# Patient Record
Sex: Male | Born: 1965 | Race: Black or African American | Hispanic: No | Marital: Married | Smoking: Former smoker
Health system: Southern US, Community
[De-identification: ages and names within clinical notes are randomized; demographics above are authoritative.]

## PROBLEM LIST (undated history)

## (undated) HISTORY — PX: TOOTH EXTRACTION: SUR596

---

## 2008-01-21 ENCOUNTER — Encounter: Admission: RE | Admit: 2008-01-21 | Discharge: 2008-01-21 | Payer: Self-pay | Admitting: Family Medicine

## 2009-04-19 ENCOUNTER — Ambulatory Visit (HOSPITAL_BASED_OUTPATIENT_CLINIC_OR_DEPARTMENT_OTHER): Admission: RE | Admit: 2009-04-19 | Discharge: 2009-04-19 | Payer: Self-pay | Admitting: Urology

## 2011-03-22 ENCOUNTER — Encounter: Payer: Self-pay | Admitting: Internal Medicine

## 2011-03-22 ENCOUNTER — Ambulatory Visit (INDEPENDENT_AMBULATORY_CARE_PROVIDER_SITE_OTHER): Payer: 59 | Admitting: Internal Medicine

## 2011-03-22 ENCOUNTER — Other Ambulatory Visit (INDEPENDENT_AMBULATORY_CARE_PROVIDER_SITE_OTHER): Payer: 59

## 2011-03-22 VITALS — BP 122/80 | HR 81 | Temp 98.3°F | Resp 16 | Ht 67.0 in | Wt 182.0 lb

## 2011-03-22 DIAGNOSIS — I10 Essential (primary) hypertension: Secondary | ICD-10-CM | POA: Insufficient documentation

## 2011-03-22 DIAGNOSIS — Z0000 Encounter for general adult medical examination without abnormal findings: Secondary | ICD-10-CM

## 2011-03-22 DIAGNOSIS — Z720 Tobacco use: Secondary | ICD-10-CM | POA: Insufficient documentation

## 2011-03-22 DIAGNOSIS — Z136 Encounter for screening for cardiovascular disorders: Secondary | ICD-10-CM | POA: Insufficient documentation

## 2011-03-22 DIAGNOSIS — F17200 Nicotine dependence, unspecified, uncomplicated: Secondary | ICD-10-CM

## 2011-03-22 DIAGNOSIS — R9431 Abnormal electrocardiogram [ECG] [EKG]: Secondary | ICD-10-CM | POA: Insufficient documentation

## 2011-03-22 DIAGNOSIS — R55 Syncope and collapse: Secondary | ICD-10-CM | POA: Insufficient documentation

## 2011-03-22 LAB — URINALYSIS, ROUTINE W REFLEX MICROSCOPIC
Hgb urine dipstick: NEGATIVE
Nitrite: NEGATIVE
Specific Gravity, Urine: 1.02 (ref 1.000–1.030)
Total Protein, Urine: NEGATIVE
Urine Glucose: NEGATIVE
pH: 7 (ref 5.0–8.0)

## 2011-03-22 LAB — CBC WITH DIFFERENTIAL/PLATELET
Basophils Relative: 0.7 % (ref 0.0–3.0)
Eosinophils Absolute: 0.3 10*3/uL (ref 0.0–0.7)
Eosinophils Relative: 4.1 % (ref 0.0–5.0)
HCT: 46.8 % (ref 39.0–52.0)
Lymphs Abs: 2.3 10*3/uL (ref 0.7–4.0)
MCHC: 33.5 g/dL (ref 30.0–36.0)
MCV: 91.6 fl (ref 78.0–100.0)
Monocytes Absolute: 0.6 10*3/uL (ref 0.1–1.0)
Neutrophils Relative %: 52 % (ref 43.0–77.0)
RBC: 5.11 Mil/uL (ref 4.22–5.81)
WBC: 6.7 10*3/uL (ref 4.5–10.5)

## 2011-03-22 LAB — COMPREHENSIVE METABOLIC PANEL
AST: 18 U/L (ref 0–37)
Alkaline Phosphatase: 64 U/L (ref 39–117)
BUN: 17 mg/dL (ref 6–23)
Creatinine, Ser: 1.4 mg/dL (ref 0.4–1.5)
Glucose, Bld: 93 mg/dL (ref 70–99)
Potassium: 4.4 mEq/L (ref 3.5–5.1)
Total Bilirubin: 0 mg/dL — ABNORMAL LOW (ref 0.3–1.2)

## 2011-03-22 LAB — LIPID PANEL
Cholesterol: 190 mg/dL (ref 0–200)
LDL Cholesterol: 114 mg/dL — ABNORMAL HIGH (ref 0–99)
Triglycerides: 147 mg/dL (ref 0.0–149.0)

## 2011-03-22 LAB — PSA: PSA: 0.42 ng/mL (ref 0.10–4.00)

## 2011-03-22 NOTE — Assessment & Plan Note (Signed)
He agrees to try to quit smoking but he does not want any "helpers" he will try to abruptly quit on his own

## 2011-03-22 NOTE — Patient Instructions (Addendum)
Health Maintenance, Males A healthy lifestyle and preventative care can promote health and wellness.  Maintain regular health, dental, and eye exams.   Eat a healthy diet. Foods like vegetables, fruits, whole grains, low-fat dairy products, and lean protein foods contain the nutrients you need without too many calories. Decrease your intake of foods high in solid fats, added sugars, and salt. Get information about a proper diet from your caregiver, if necessary.   Regular physical exercise is one of the most important things you can do for your health. Most adults should get at least 150 minutes of moderate-intensity exercise (any activity that increases your heart rate and causes you to sweat) each week. In addition, most adults need muscle-strengthening exercises on 2 or more days a week.    Maintain a healthy weight. The body mass index (BMI) is a screening tool to identify possible weight problems. It provides an estimate of body fat based on height and weight. Your caregiver can help determine your BMI, and can help you achieve or maintain a healthy weight. For adults 20 years and older:   A BMI below 18.5 is considered underweight.   A BMI of 18.5 to 24.9 is normal.   A BMI of 25 to 29.9 is considered overweight.   A BMI of 30 and above is considered obese.   Maintain normal blood lipids and cholesterol by exercising and minimizing your intake of saturated fat. Eat a balanced diet with plenty of fruits and vegetables. Blood tests for lipids and cholesterol should begin at age 20 and be repeated every 5 years. If your lipid or cholesterol levels are high, you are over 50, or you are a high risk for heart disease, you may need your cholesterol levels checked more frequently.Ongoing high lipid and cholesterol levels should be treated with medicines, if diet and exercise are not effective.   If you smoke, find out from your caregiver how to quit. If you do not use tobacco, do not start.    If you choose to drink alcohol, do not exceed 2 drinks per day. One drink is considered to be 12 ounces (355 mL) of beer, 5 ounces (148 mL) of wine, or 1.5 ounces (44 mL) of liquor.   Avoid use of street drugs. Do not share needles with anyone. Ask for help if you need support or instructions about stopping the use of drugs.   High blood pressure causes heart disease and increases the risk of stroke. Blood pressure should be checked at least every 1 to 2 years. Ongoing high blood pressure should be treated with medicines if weight loss and exercise are not effective.   If you are 45 to 45 years old, ask your caregiver if you should take aspirin to prevent heart disease.   Diabetes screening involves taking a blood sample to check your fasting blood sugar level. This should be done once every 3 years, after age 45, if you are within normal weight and without risk factors for diabetes. Testing should be considered at a younger age or be carried out more frequently if you are overweight and have at least 1 risk factor for diabetes.   Colorectal cancer can be detected and often prevented. Most routine colorectal cancer screening begins at the age of 50 and continues through age 75. However, your caregiver may recommend screening at an earlier age if you have risk factors for colon cancer. On a yearly basis, your caregiver may provide home test kits to check for hidden   blood in the stool. Use of a small camera at the end of a tube, to directly examine the colon (sigmoidoscopy or colonoscopy), can detect the earliest forms of colorectal cancer. Talk to your caregiver about this at age 60, when routine screening begins. Direct examination of the colon should be repeated every 5 to 10 years through age 21, unless early forms of pre-cancerous polyps or small growths are found.   Hepatitis C blood testing is recommended for all people born from [birth year] through 1964 and any individual with known risks for  hepatitis C.   Healthy men should no longer receive prostate-specific antigen (PSA) blood tests as part of routine cancer screening. Consult with your caregiver about prostate cancer screening.   Testicular cancer screening is not recommended for adolescents or adult males who have no symptoms. Screening includes self-exam, caregiver exam, and other screening tests. Consult with your caregiver about any symptoms you have or any concerns you have about testicular cancer.   Practice safe sex. Use condoms and avoid high-risk sexual practices to reduce the spread of sexually transmitted infections (STIs).   Use sunscreen with a sun protection factor (SPF) of 30 or greater. Apply sunscreen liberally and repeatedly throughout the day. You should seek shade when your shadow is shorter than you. Protect yourself by wearing long sleeves, pants, a wide-brimmed hat, and sunglasses year round, whenever you are outdoors.   Notify your caregiver of new moles or changes in moles, especially if there is a change in shape or color. Also notify your caregiver if a mole is larger than the size of a pencil eraser.   A one-time screening for abdominal aortic aneurysm (AAA) and surgical repair of large AAAs by sound wave imaging (ultrasonography) is recommended for ages 22 to 21 years who are current or former smokers.   Stay current with your immunizations.  Document Released: 06/23/2007 Document Revised: 12/14/2010 Document Reviewed: 05/22/2010 [facility] Patient Information 2012 [place], [place].Smoking Cessation This document explains the best ways for you to quit smoking and new treatments to help. It lists new medicines that can double or triple your chances of quitting and quitting for good. It also considers ways to avoid relapses and concerns you may have about quitting, including weight gain. NICOTINE: A POWERFUL ADDICTION If you have tried to quit smoking, you know how hard it can be. It is hard because  nicotine is a very addictive drug. For some people, it can be as addictive as heroin or cocaine. Usually, people make 2 or 3 tries, or more, before finally being able to quit. Each time you try to quit, you can learn about what helps and what hurts. Quitting takes hard work and a lot of effort, but you can quit smoking. QUITTING SMOKING IS ONE OF THE MOST IMPORTANT THINGS YOU WILL EVER DO.  You will live longer, feel better, and live better.   The impact on your body of quitting smoking is felt almost immediately:   Within 20 minutes, blood pressure decreases. Pulse returns to its normal level.   After 8 hours, carbon monoxide levels in the blood return to normal. Oxygen level increases.   After 24 hours, chance of heart attack starts to decrease. Breath, hair, and body stop smelling like smoke.   After 48 hours, damaged nerve endings begin to recover. Sense of taste and smell improve.   After 72 hours, the body is virtually free of nicotine. Bronchial tubes relax and breathing becomes easier.   After 2 to  12 weeks, lungs can hold more air. Exercise becomes easier and circulation improves.   Quitting will reduce your risk of having a heart attack, stroke, cancer, or lung disease:   After 1 year, the risk of coronary heart disease is cut in half.   After 5 years, the risk of stroke falls to the same as a nonsmoker.   After 10 years, the risk of lung cancer is cut in half and the risk of other cancers decreases significantly.   After 15 years, the risk of coronary heart disease drops, usually to the level of a nonsmoker.   If you are pregnant, quitting smoking will improve your chances of having a healthy baby.   The people you live with, especially your children, will be healthier.   You will have extra money to spend on things other than cigarettes.  FIVE KEYS TO QUITTING Studies have shown that these 5 steps will help you quit smoking and quit for good. You have the best chances  of quitting if you use them together: 1. Get ready.  2. Get support and encouragement.  3. Learn new skills and behaviors.  4. Get medicine to reduce your nicotine addiction and use it correctly.  5. Be prepared for relapse or difficult situations. Be determined to continue trying to quit, even if you do not succeed at first.  1. GET READY  Set a quit date.   Change your environment.   Get rid of ALL cigarettes, ashtrays, matches, and lighters in your home, car, and place of work.   Do not let people smoke in your home.   Review your past attempts to quit. Think about what worked and what did not.   Once you quit, do not smoke. NOT EVEN A PUFF!  2. GET SUPPORT AND ENCOURAGEMENT Studies have shown that you have a better chance of being successful if you have help. You can get support in many ways.  Tell your family, friends, and coworkers that you are going to quit and need their support. Ask them not to smoke around you.   Talk to your caregivers (doctor, dentist, nurse, pharmacist, psychologist, and/or smoking counselor).   Get individual, group, or telephone counseling and support. The more counseling you have, the better your chances are of quitting. Programs are available at [employer] and health centers. Call your local health department for information about programs in your area.   Spiritual beliefs and practices may help some smokers quit.   Quit meters are Photographer that keep track of quit statistics, such as amount of "quit-time," cigarettes not smoked, and money saved.   Many smokers find one or more of the many self-help books available useful in helping them quit and stay off tobacco.  3. LEARN NEW SKILLS AND BEHAVIORS  Try to distract yourself from urges to smoke. Talk to someone, go for a walk, or occupy your time with a task.   When you first try to quit, change your routine. Take a different route to work. Drink tea  instead of coffee. Eat breakfast in a different place.   Do something to reduce your stress. Take a hot bath, exercise, or read a book.   Plan something enjoyable to do every day. Reward yourself for not smoking.   Explore interactive web-based programs that specialize in helping you quit.  4. GET MEDICINE AND USE IT CORRECTLY Medicines can help you stop smoking and decrease the urge to smoke. Combining medicine  with the above behavioral methods and support can quadruple your chances of successfully quitting smoking. The [place] Food and Drug Administration (FDA) has approved 7 medicines to help you quit smoking. These medicines fall into 3 categories.  Nicotine replacement therapy (delivers nicotine to your body without the negative effects and risks of smoking):   Nicotine gum: Available over-the-counter.   Nicotine lozenges: Available over-the-counter.   Nicotine inhaler: Available by prescription.   Nicotine nasal spray: Available by prescription.   Nicotine skin patches (transdermal): Available by prescription and over-the-counter.   Antidepressant medicine (helps people abstain from smoking, but how this works is unknown):   Bupropion sustained-release (SR) tablets: Available by prescription.   Nicotinic receptor partial agonist (simulates the effect of nicotine in your brain):   Varenicline tartrate tablets: Available by prescription.   Ask your caregiver for advice about which medicines to use and how to use them. Carefully read the information on the package.   Everyone who is trying to quit may benefit from using a medicine. If you are pregnant or trying to become pregnant, nursing an infant, you are under age 36, or you smoke fewer than 10 cigarettes per day, talk to your caregiver before taking any nicotine replacement medicines.   You should stop using a nicotine replacement product and call your caregiver if you experience nausea, dizziness, weakness, vomiting, fast or  irregular heartbeat, mouth problems with the lozenge or gum, or redness or swelling of the skin around the patch that does not go away.   Do not use any other product containing nicotine while using a nicotine replacement product.   Talk to your caregiver before using these products if you have diabetes, heart disease, asthma, stomach ulcers, you had a recent heart attack, you have high blood pressure that is not controlled with medicine, a history of irregular heartbeat, or you have been prescribed medicine to help you quit smoking.  5. BE PREPARED FOR RELAPSE OR DIFFICULT SITUATIONS  Most relapses occur within the first 3 months after quitting. Do not be discouraged if you start smoking again. Remember, most people try several times before they finally quit.   You may have symptoms of withdrawal because your body is used to nicotine. You may crave cigarettes, be irritable, feel very hungry, cough often, get headaches, or have difficulty concentrating.   The withdrawal symptoms are only temporary. They are strongest when you first quit, but they will go away within 10 to 14 days.  Here are some difficult situations to watch for:  Alcohol. Avoid drinking alcohol. Drinking lowers your chances of successfully quitting.   Caffeine. Try to reduce the amount of caffeine you consume. It also lowers your chances of successfully quitting.   Other smokers. Being around smoking can make you want to smoke. Avoid smokers.   Weight gain. Many smokers will gain weight when they quit, usually less than 10 pounds. Eat a healthy diet and stay active. Do not let weight gain distract you from your main goal, quitting smoking. Some medicines that help you quit smoking may also help delay weight gain. You can always lose the weight gained after you quit.   Bad mood or depression. There are a lot of ways to improve your mood other than smoking.  If you are having problems with any of these situations, talk to your  caregiver. SPECIAL SITUATIONS AND CONDITIONS Studies suggest that everyone can quit smoking. Your situation or condition can give you a special reason to quit.  Pregnant  women/new mothers: By quitting, you protect your baby's health and your own.   Hospitalized patients: By quitting, you reduce health problems and help healing.   Heart attack patients: By quitting, you reduce your risk of a second heart attack.   Lung, head, and neck cancer patients: By quitting, you reduce your chance of a second cancer.   Parents of children and adolescents: By quitting, you protect your children from illnesses caused by secondhand smoke.  QUESTIONS TO THINK ABOUT Think about the following questions before you try to stop smoking. You may want to talk about your answers with your caregiver.  Why do you want to quit?   If you tried to quit in the past, what helped and what did not?   What will be the most difficult situations for you after you quit? How will you plan to handle them?   Who can help you through the tough times? Your family? Friends? Caregiver?   What pleasures do you get from smoking? What ways can you still get pleasure if you quit?  Here are some questions to ask your caregiver:  How can you help me to be successful at quitting?   What medicine do you think would be best for me and how should I take it?   What should I do if I need more help?   What is smoking withdrawal like? How can I get information on withdrawal?  Quitting takes hard work and a lot of effort, but you can quit smoking. FOR MORE INFORMATION  Smokefree.gov (http://www.davis-sullivan.com/) provides free, accurate, evidence-based information and professional assistance to help support the immediate and long-term needs of people trying to quit smoking. Document Released: 12/19/2000 Document Revised: 12/14/2010 Document Reviewed: 10/11/2008 [facility] Patient Information 2012 [place], [place].

## 2011-03-22 NOTE — Assessment & Plan Note (Signed)
EKG is abnormal 

## 2011-03-22 NOTE — Assessment & Plan Note (Signed)
Exam done, labs ordered, vaccines were updated, pt ed material was given 

## 2011-03-22 NOTE — Progress Notes (Signed)
Subjective:    Patient ID: Jeffrey Deleon, male    DOB: [birth year], 46 y.o.   MRN: [id number]  Loss of Consciousness This is a new problem. Episode onset: one month ago. The problem has been unchanged. Exacerbated by: drank a lot of Etoh, smoked and played video games all day, blood sugar=59 after he was out for about 5 secs. Pertinent negatives include no abdominal pain, auditory change, aura, back pain, bladder incontinence, bowel incontinence, chest pain, clumsiness, confusion, diaphoresis, dizziness, fever, focal sensory loss, focal weakness, headaches, light-headedness, nausea, palpitations, slurred speech, vertigo, visual change, vomiting or weakness. He has tried nothing for the symptoms.      Review of Systems  Constitutional: Negative.  Negative for fever and diaphoresis.  HENT: Negative.   Eyes: Negative.   Respiratory: Negative for apnea, cough, choking, chest tightness, shortness of breath, wheezing and stridor.   Cardiovascular: Positive for syncope. Negative for chest pain, palpitations and leg swelling.  Gastrointestinal: Negative for nausea, vomiting, abdominal pain, diarrhea, constipation, blood in stool, abdominal distention, anal bleeding, rectal pain and bowel incontinence.  Genitourinary: Negative.  Negative for bladder incontinence.  Musculoskeletal: Negative for myalgias, back pain, joint swelling, arthralgias and gait problem.  Skin: Negative for color change, pallor, rash and wound.  Neurological: Positive for syncope. Negative for dizziness, vertigo, tremors, focal weakness, seizures, facial asymmetry, speech difficulty, weakness, light-headedness, numbness and headaches.  Hematological: Negative for adenopathy. Does not bruise/bleed easily.  Psychiatric/Behavioral: Negative.  Negative for confusion.       Objective:   Physical Exam  Vitals reviewed. Constitutional: He is oriented to person, place, and time. He appears well-developed and well-nourished. No  distress.  HENT:  Head: Normocephalic and atraumatic.  Mouth/Throat: Oropharynx is clear and moist. No oropharyngeal exudate.  Eyes: Conjunctivae are normal. Right eye exhibits no discharge. Left eye exhibits no discharge. No scleral icterus.  Neck: Normal range of motion. Neck supple. No JVD present. No tracheal deviation present. No thyromegaly present.  Cardiovascular: Normal rate, regular rhythm, normal heart sounds and intact distal pulses.  Exam reveals no gallop and no friction rub.   No murmur heard. Pulmonary/Chest: Effort normal and breath sounds normal. No stridor. No respiratory distress. He has no wheezes. He has no rales. He exhibits no tenderness.  Abdominal: Soft. Bowel sounds are normal. He exhibits no distension. There is no tenderness. There is no rebound and no guarding. Hernia confirmed negative in the right inguinal area and confirmed negative in the left inguinal area.  Genitourinary: Rectum normal, testes normal and penis normal. Rectal exam shows no external hemorrhoid, no internal hemorrhoid, no fissure, no mass, no tenderness and anal tone normal. Guaiac negative stool. 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 Prostate is not tender. Right testis shows no mass, no swelling and no tenderness. Right testis is descended. Left testis shows no mass, no swelling and no tenderness. Left testis is descended. Circumcised. No penile tenderness. No discharge found.  Musculoskeletal: Normal range of motion. He exhibits no edema and no tenderness.  Lymphadenopathy:    He has no cervical adenopathy.       Right: No inguinal adenopathy present.       Left: No inguinal adenopathy present.  Neurological: He is oriented to person, place, and time. He displays normal reflexes. No cranial nerve deficit. He exhibits normal muscle tone. Coordination normal.  Skin: Skin is warm and dry. No rash noted. He is not diaphoretic. No erythema. No pallor.  Psychiatric: He has a normal mood and  affect. His behavior is normal. Judgment and thought content normal.          Assessment & Plan:

## 2011-03-22 NOTE — Assessment & Plan Note (Signed)
The episode one month ago had features of low blood sugar due to self neglect and a binge on beer and hennessey but he also has an abnormal EKG so I have sent him to cardiology, he has not had any recurrences since then so I do not see any need for urgent intervention at this time

## 2011-03-22 NOTE — Assessment & Plan Note (Signed)
He has q waves and t wave changes in V1 and V2, I am concerned that he may have had a cardiac event one month ago and therefore I referred him to cardiology

## 2011-04-23 ENCOUNTER — Ambulatory Visit (INDEPENDENT_AMBULATORY_CARE_PROVIDER_SITE_OTHER): Payer: 59 | Admitting: Cardiovascular Disease

## 2011-04-23 ENCOUNTER — Encounter: Payer: Self-pay | Admitting: Cardiovascular Disease

## 2011-04-23 ENCOUNTER — Institutional Professional Consult (permissible substitution): Payer: 59 | Admitting: Cardiology

## 2011-04-23 VITALS — BP 137/92 | HR 88 | Ht 68.0 in | Wt 185.0 lb

## 2011-04-23 DIAGNOSIS — R55 Syncope and collapse: Secondary | ICD-10-CM

## 2011-04-23 DIAGNOSIS — R9431 Abnormal electrocardiogram [ECG] [EKG]: Secondary | ICD-10-CM

## 2011-04-23 DIAGNOSIS — Z720 Tobacco use: Secondary | ICD-10-CM

## 2011-04-23 DIAGNOSIS — F17200 Nicotine dependence, unspecified, uncomplicated: Secondary | ICD-10-CM

## 2011-04-23 NOTE — Assessment & Plan Note (Signed)
He has mild changes in the precordial leads. Only risk factor for CAD is smoking. Will arrange exercise treadmill stress test to exclude ischemia.

## 2011-04-23 NOTE — Progress Notes (Signed)
   History of Present Illness: 46 yo male with no significant past medical who is referred today for evaluation of a syncopal episode and abnormal EKG. He was seen recently by Dr. Yetta Barre. He reports that he was home playing x-box and drinking etoh, smoking. He stood up and walked to the other room and he sat down. He felt cold, clammy, diaphoretic and he passed out. His wife witnessed this and says he was out for 15 seconds. No seizure activity. No post-ictal state.   He tells me that he feels well. NO recurrent episodes of syncope. He works for the [employer] and has no chest pain or SOB while at work. He plays video games on his days off. He is trying to stop smoking. He does not exercise.   Primary Care Physician: Sanda Linger  PMH: None  Past Surgical History  Procedure Date  . Tooth extraction     Current Outpatient Prescriptions  Medication Sig Dispense Refill  . ibuprofen (ADVIL) 200 MG tablet Take 200 mg by mouth every 6 (six) hours as needed.      . Naproxen Sodium (ALEVE) 220 MG CAPS Take by mouth. As needed for headaches        No Known Allergies  History   Social History  . Marital Status: Married    Spouse Name: N/A    Number of Children: 2  . Years of Education: N/A   Occupational History  . [employer]   Social History Main Topics  . Smoking status: Current Everyday Smoker -- 0.5 packs/day for 30 years    Types: Cigarettes  . Smokeless tobacco: Not on file  . Alcohol Use: 2.4 oz/week    2 Cans of beer, 2 Shots of liquor per week  . Drug Use: No  . Sexually Active: Yes   Other Topics Concern  . Not on file   Social History Narrative   Caffienated drinks-yesSeat belt use often-yesRegular Exercise-noSmoke alarm in the home-yesFirearms/guns in the home-yesHistory of physical abuse-no    Family History  Problem Relation Age of Onset  . Alcohol abuse Neg Hx   . Diabetes Neg Hx   . Early death Neg Hx   . Heart disease Neg  Hx   . Hyperlipidemia Neg Hx   . Kidney disease Neg Hx   . Hypertension Neg Hx   . Stroke Neg Hx   . Cancer Father     Pancreatic    Review of Systems:  As stated in the HPI and otherwise negative.   BP 137/92  Ht 5\' 8"  (1.727 m)  Wt 185 lb (83.915 kg)  BMI 28.13 kg/m2  Physical Examination: General: Well developed, well nourished, NAD HEENT: OP clear, mucus membranes moist SKIN: warm, dry. No rashes. Neuro: No focal deficits Musculoskeletal: Muscle strength 5/5 all ext Psychiatric: Mood and affect normal Neck: No JVD, no carotid bruits, no thyromegaly, no lymphadenopathy. Lungs:Clear bilaterally, no wheezes, rhonci, crackles Cardiovascular: Regular rate and rhythm. No murmurs, gallops or rubs. Abdomen:Soft. Bowel sounds present. Non-tender.  Extremities: No lower extremity edema. Pulses are 2 + in the bilateral DP/PT.  EKG: NSR, rate 88 bpm.

## 2011-04-23 NOTE — Assessment & Plan Note (Signed)
Smoking cessation is advised.

## 2011-04-23 NOTE — Assessment & Plan Note (Signed)
No further episodes. ?etiology. Will get echo to assess LV size and function. He is advised to cut back on etoh intake

## 2011-04-23 NOTE — Patient Instructions (Signed)
Your physician recommends that you schedule a follow-up appointment in: 3-4 weeks.   Your physician has requested that you have an exercise tolerance test. For further information please visit [email or web address]. Please also follow instruction sheet, as given.   Your physician has requested that you have an echocardiogram. Echocardiography is a painless test that uses sound waves to create images of your heart. It provides your doctor with information about the size and shape of your heart and how well your heart's chambers and valves are working. This procedure takes approximately one hour. There are no restrictions for this procedure.    

## 2011-05-02 ENCOUNTER — Encounter: Payer: Self-pay | Admitting: Nurse Practitioner

## 2011-05-02 ENCOUNTER — Ambulatory Visit (INDEPENDENT_AMBULATORY_CARE_PROVIDER_SITE_OTHER): Payer: 59 | Admitting: Nurse Practitioner

## 2011-05-02 DIAGNOSIS — R55 Syncope and collapse: Secondary | ICD-10-CM

## 2011-05-02 DIAGNOSIS — R9431 Abnormal electrocardiogram [ECG] [EKG]: Secondary | ICD-10-CM

## 2011-05-02 NOTE — Procedures (Signed)
Exercise Treadmill Test  Pre-Exercise Testing Evaluation Rhythm: normal sinus  Rate: 98   PR:  .14 QRS:  .08  QT:  .33 QTc: .42     Test  Exercise Tolerance Test Ordering MD: Melene Muller, MD  Interpreting MD:  Ward Givens , NP  Unique Test No: 1  Treadmill:  1  Indication for ETT: Syncope  Contraindication to ETT: No   Stress Modality: exercise - treadmill  Cardiac Imaging Performed: non   Protocol: standard Bruce - maximal  Max BP:  158/74  Max MPHR (bpm):  176 85% MPR (bpm):  149  MPHR obtained (bpm):  181 % MPHR obtained:  102%  Reached 85% MPHR (min:sec): 6:00 Total Exercise Time (min-sec):  9:37  Workload in METS:  11.0 Borg Scale: 15  Reason ETT Terminated:  fatigue    ST Segment Analysis At Rest: normal ST segments - no evidence of significant ST depression With Exercise: no evidence of significant ST depression  Other Information Arrhythmia:  No Angina during ETT:  absent (0) Quality of ETT:  diagnostic  ETT Interpretation:  normal - no evidence of ischemia by ST analysis  Comments: Good exercise tolerance, no acute st/t changes.  Recommendations: F/U Dr. Clifton [place]

## 2011-05-09 ENCOUNTER — Institutional Professional Consult (permissible substitution): Payer: 59 | Admitting: Cardiology

## 2011-05-09 ENCOUNTER — Other Ambulatory Visit: Payer: Self-pay

## 2011-05-09 ENCOUNTER — Ambulatory Visit (HOSPITAL_COMMUNITY): Payer: 59 | Attending: Cardiology

## 2011-05-09 DIAGNOSIS — F1010 Alcohol abuse, uncomplicated: Secondary | ICD-10-CM | POA: Insufficient documentation

## 2011-05-09 DIAGNOSIS — R9431 Abnormal electrocardiogram [ECG] [EKG]: Secondary | ICD-10-CM

## 2011-05-09 DIAGNOSIS — F17200 Nicotine dependence, unspecified, uncomplicated: Secondary | ICD-10-CM | POA: Insufficient documentation

## 2011-05-09 DIAGNOSIS — R55 Syncope and collapse: Secondary | ICD-10-CM | POA: Insufficient documentation

## 2011-05-22 ENCOUNTER — Encounter: Payer: Self-pay | Admitting: Cardiovascular Disease

## 2011-05-22 ENCOUNTER — Ambulatory Visit (INDEPENDENT_AMBULATORY_CARE_PROVIDER_SITE_OTHER): Payer: 59 | Admitting: Cardiovascular Disease

## 2011-05-22 VITALS — BP 130/85 | HR 76 | Ht 68.0 in | Wt 187.0 lb

## 2011-05-22 DIAGNOSIS — R55 Syncope and collapse: Secondary | ICD-10-CM

## 2011-05-22 NOTE — Assessment & Plan Note (Signed)
Echo normal. ETT ok. No further syncope. No further workup needed. Smoking cessation encouraged.

## 2011-05-22 NOTE — Patient Instructions (Signed)
Your physician recommends that you schedule a follow-up appointment as needed with Dr. McAlhany   

## 2011-05-22 NOTE — Progress Notes (Signed)
History of Present Illness: 46 yo male with no significant past medical who is here today for cardiac follow up. I saw him last month for evaluation of a syncopal episode and abnormal EKG. He was seen recently by Dr. Yetta Barre. He reported that he was home playing x-box and drinking etoh, smoking. He stood up and walked to the other room and he sat down. He felt cold, clammy, diaphoretic and he passed out. His wife witnessed this and says he was out for 15 seconds. No seizure activity. No post-ictal state. He felt well at our first visit. I arranged an echo which was normal and a treadmill stress test which showed no ischemia.   He tells me that he feels well. NO recurrent episodes of syncope. He works for the [employer] and has no chest pain or SOB while at work. He plays video games on his days off. He is trying to stop smoking. He does not exercise.   Primary Care Physician: Sanda Linger   No past medical history on file. No medical problems. cdm  Past Surgical History  Procedure Date  . Tooth extraction     Current Outpatient Prescriptions  Medication Sig Dispense Refill  . ibuprofen (ADVIL) 200 MG tablet Take 200 mg by mouth every 6 (six) hours as needed.      . Naproxen Sodium (ALEVE) 220 MG CAPS Take by mouth. As needed for headaches        Not on File NKDA  History   Social History  . Marital Status: Married    Spouse Name: N/A    Number of Children: 2  . Years of Education: N/A   Occupational History  . [employer]   Social History Main Topics  . Smoking status: Current Everyday Smoker -- 0.5 packs/day for 30 years    Types: Cigarettes  . Smokeless tobacco: Not on file  . Alcohol Use: 2.4 oz/week    2 Cans of beer, 2 Shots of liquor per week  . Drug Use: No  . Sexually Active: Yes   Other Topics Concern  . Not on file   Social History Narrative   Caffienated drinks-yesSeat belt use often-yesRegular Exercise-noSmoke alarm in the  home-yesFirearms/guns in the home-yesHistory of physical abuse-no    Family History  Problem Relation Age of Onset  . Alcohol abuse Neg Hx   . Diabetes Neg Hx   . Early death Neg Hx   . Heart disease Neg Hx   . Hyperlipidemia Neg Hx   . Kidney disease Neg Hx   . Hypertension Neg Hx   . Stroke Neg Hx   . Cancer Father     Pancreatic    Review of Systems:  As stated in the HPI and otherwise negative.   BP 130/85  Pulse 76  Ht 5\' 8"  (1.727 m)  Wt 187 lb (84.823 kg)  BMI 28.43 kg/m2  Physical Examination: General: Well developed, well nourished, NAD HEENT: OP clear, mucus membranes moist SKIN: warm, dry. No rashes. Neuro: No focal deficits Musculoskeletal: Muscle strength 5/5 all ext Psychiatric: Mood and affect normal Neck: No JVD, no carotid bruits, no thyromegaly, no lymphadenopathy. Lungs:Clear bilaterally, no wheezes, rhonci, crackles Cardiovascular: Regular rate and rhythm. No murmurs, gallops or rubs. Abdomen:Soft. Bowel sounds present. Non-tender.  Extremities: No lower extremity edema. Pulses are 2 + in the bilateral DP/PT.  Echo 05/09/11:  Left ventricle: The cavity size was normal. Systolic function was normal. The estimated ejection fraction  was in the range of 55% to 60%. Wall motion was normal; there were no regional wall motion abnormalities. - Atrial septum: No defect or patent foramen ovale was Identified.  ETT: 05/02/11:  Protocol: standard Bruce - maximal Max BP: 158/74  Max MPHR (bpm): 176 85% MPR (bpm): 149  MPHR obtained (bpm): 181 % MPHR obtained: 102%  Reached 85% MPHR (min:sec): 6:00 Total Exercise Time (min-sec):  9:37  Workload in METS: 11.0 Borg Scale: 15  Reason ETT Terminated: fatigue   ST Segment Analysis At Rest: normal ST segments - no evidence of significant ST  depression With Exercise: no evidence of significant ST depression  Other Information Arrhythmia: No Angina during ETT: absent (0) Quality of ETT: diagnostic  ETT  Interpretation: normal - no evidence of ischemia by ST  analysis  Comments: Good exercise tolerance, no acute st/t changes.

## 2013-06-24 ENCOUNTER — Ambulatory Visit (INDEPENDENT_AMBULATORY_CARE_PROVIDER_SITE_OTHER): Payer: 59 | Admitting: Internal Medicine

## 2013-06-24 ENCOUNTER — Encounter: Payer: Self-pay | Admitting: Internal Medicine

## 2013-06-24 ENCOUNTER — Other Ambulatory Visit (INDEPENDENT_AMBULATORY_CARE_PROVIDER_SITE_OTHER): Payer: 59

## 2013-06-24 VITALS — BP 122/80 | HR 87 | Temp 97.1°F | Resp 16 | Ht 68.0 in | Wt 188.0 lb

## 2013-06-24 DIAGNOSIS — Z23 Encounter for immunization: Secondary | ICD-10-CM

## 2013-06-24 DIAGNOSIS — Z0000 Encounter for general adult medical examination without abnormal findings: Secondary | ICD-10-CM

## 2013-06-24 LAB — LIPID PANEL
CHOLESTEROL: 172 mg/dL (ref 0–200)
HDL: 45.3 mg/dL (ref >39.00)
LDL CALC: 108 mg/dL — AB (ref 0–99)
NonHDL: 126.7
TRIGLYCERIDES: 93 mg/dL (ref 0.0–149.0)
Total CHOL/HDL Ratio: 4
VLDL: 18.6 mg/dL (ref 0.0–40.0)

## 2013-06-24 LAB — CBC WITH DIFFERENTIAL/PLATELET
Basophils Absolute: 0 10*3/uL (ref 0.0–0.1)
Basophils Relative: 0.6 % (ref 0.0–3.0)
Eosinophils Absolute: 0.3 10*3/uL (ref 0.0–0.7)
Eosinophils Relative: 4.3 % (ref 0.0–5.0)
HCT: 48.5 % (ref 39.0–52.0)
Hemoglobin: 16 g/dL (ref 13.0–17.0)
Lymphocytes Relative: 38.4 % (ref 12.0–46.0)
Lymphs Abs: 2.4 10*3/uL (ref 0.7–4.0)
MCHC: 33 g/dL (ref 30.0–36.0)
MCV: 90.7 fl (ref 78.0–100.0)
Monocytes Absolute: 0.6 10*3/uL (ref 0.1–1.0)
Monocytes Relative: 8.8 % (ref 3.0–12.0)
Neutro Abs: 3 10*3/uL (ref 1.4–7.7)
Neutrophils Relative %: 47.9 % (ref 43.0–77.0)
Platelets: 355 10*3/uL (ref 150.0–400.0)
RBC: 5.34 Mil/uL (ref 4.22–5.81)
RDW: 14.4 % (ref 11.5–15.5)
WBC: 6.3 10*3/uL (ref 4.0–10.5)

## 2013-06-24 LAB — COMPREHENSIVE METABOLIC PANEL
ALK PHOS: 67 U/L (ref 39–117)
ALT: 16 U/L (ref 0–53)
AST: 17 U/L (ref 0–37)
Albumin: 4.3 g/dL (ref 3.5–5.2)
BUN: 11 mg/dL (ref 6–23)
CALCIUM: 9.6 mg/dL (ref 8.4–10.5)
CHLORIDE: 106 meq/L (ref 96–112)
CO2: 30 mEq/L (ref 19–32)
CREATININE: 1.1 mg/dL (ref 0.4–1.5)
GFR: 97.55 mL/min (ref >60.00)
Glucose, Bld: 88 mg/dL (ref 70–99)
Potassium: 4.3 mEq/L (ref 3.5–5.1)
Sodium: 140 mEq/L (ref 135–145)
Total Bilirubin: 0.5 mg/dL (ref 0.2–1.2)
Total Protein: 7 g/dL (ref 6.0–8.3)

## 2013-06-24 LAB — TSH: TSH: 0.87 u[IU]/mL (ref 0.35–4.50)

## 2013-06-24 LAB — PSA: PSA: 0.36 ng/mL (ref 0.10–4.00)

## 2013-06-24 LAB — FECAL OCCULT BLOOD, GUAIAC: Fecal Occult Blood: NEGATIVE

## 2013-06-24 NOTE — Progress Notes (Signed)
Pre visit review using our clinic review tool, if applicable. No additional management support is needed unless otherwise documented below in the visit note. 

## 2013-06-24 NOTE — Patient Instructions (Signed)
Health Maintenance A healthy lifestyle and preventative care can promote health and wellness.  Maintain regular health, dental, and eye exams.  Eat a healthy diet. Foods like vegetables, fruits, whole grains, low-fat dairy products, and lean protein foods contain the nutrients you need and are low in calories. Decrease your intake of foods high in solid fats, added sugars, and salt. Get information about a proper diet from your health care Oziel Beitler, if necessary.  Regular physical exercise is one of the most important things you can do for your health. Most adults should get at least 150 minutes of moderate-intensity exercise (any activity that increases your heart rate and causes you to sweat) each week. In addition, most adults need muscle-strengthening exercises on 2 or more days a week.   Maintain a healthy weight. The body mass index (BMI) is a screening tool to identify possible weight problems. It provides an estimate of body fat based on height and weight. Your health care Treonna Klee can find your BMI and can help you achieve or maintain a healthy weight. For males 20 years and older:  A BMI below 18.5 is considered underweight.  A BMI of 18.5 to 24.9 is normal.  A BMI of 25 to 29.9 is considered overweight.  A BMI of 30 and above is considered obese.  Maintain normal blood lipids and cholesterol by exercising and minimizing your intake of saturated fat. Eat a balanced diet with plenty of fruits and vegetables. Blood tests for lipids and cholesterol should begin at age 20 and be repeated every 5 years. If your lipid or cholesterol levels are high, you are over age 50, or you are at high risk for heart disease, you may need your cholesterol levels checked more frequently.Ongoing high lipid and cholesterol levels should be treated with medicines if diet and exercise are not working.  If you smoke, find out from your health care Darra Rosa how to quit. If you do not use tobacco, do not  start.  Lung cancer screening is recommended for adults aged 55-80 years who are at high risk for developing lung cancer because of a history of smoking. A yearly low-dose CT scan of the lungs is recommended for people who have at least a 30-pack-year history of smoking and are current smokers or have quit within the past 15 years. A pack year of smoking is smoking an average of 1 pack of cigarettes a day for 1 year (for example, a 30-pack-year history of smoking could mean smoking 1 pack a day for 30 years or 2 packs a day for 15 years). Yearly screening should continue until the smoker has stopped smoking for at least 15 years. Yearly screening should be stopped for people who develop a health problem that would prevent them from having lung cancer treatment.  If you choose to drink alcohol, do not have more than 2 drinks per day. One drink is considered to be 12 oz (360 mL) of beer, 5 oz (150 mL) of wine, or 1.5 oz (45 mL) of liquor.  Avoid the use of street drugs. Do not share needles with anyone. Ask for help if you need support or instructions about stopping the use of drugs.  High blood pressure causes heart disease and increases the risk of stroke. Blood pressure should be checked at least every 1-2 years. Ongoing high blood pressure should be treated with medicines if weight loss and exercise are not effective.  If you are 45-79 years old, ask your health care Freida Nebel if   you should take aspirin to prevent heart disease.  Diabetes screening involves taking a blood sample to check your fasting blood sugar level. This should be done once every 3 years after age 45 if you are at a normal weight and without risk factors for diabetes. Testing should be considered at a younger age or be carried out more frequently if you are overweight and have at least 1 risk factor for diabetes.  Colorectal cancer can be detected and often prevented. Most routine colorectal cancer screening begins at the age of 50  and continues through age 75. However, your health care Zylah Elsbernd may recommend screening at an earlier age if you have risk factors for colon cancer. On a yearly basis, your health care Leiliana Foody may provide home test kits to check for hidden blood in the stool. A small camera at the end of a tube may be used to directly examine the colon (sigmoidoscopy or colonoscopy) to detect the earliest forms of colorectal cancer. Talk to your health care Lorece Keach about this at age 50 when routine screening begins. A direct exam of the colon should be repeated every 5-10 years through age 75, unless early forms of precancerous polyps or small growths are found.  People who are at an increased risk for hepatitis B should be screened for this virus. You are considered at high risk for hepatitis B if:  You were born in a country where hepatitis B occurs often. Talk with your health care Verita Kuroda about which countries are considered high risk.  Your parents were born in a high-risk country and you have not received a shot to protect against hepatitis B (hepatitis B vaccine).  You have HIV or AIDS.  You use needles to inject street drugs.  You live with, or have sex with, someone who has hepatitis B.  You are a man who has sex with other men (MSM).  You get hemodialysis treatment.  You take certain medicines for conditions like cancer, organ transplantation, and autoimmune conditions.  Hepatitis C blood testing is recommended for all people born from 1945 through 1965 and any individual with known risk factors for hepatitis C.  Healthy men should no longer receive prostate-specific antigen (PSA) blood tests as part of routine cancer screening. Talk to your health care Ples Trudel about prostate cancer screening.  Testicular cancer screening is not recommended for adolescents or adult males who have no symptoms. Screening includes self-exam, a health care Monzerrat Wellen exam, and other screening tests. Consult with your  health care Meghan Tiemann about any symptoms you have or any concerns you have about testicular cancer.  Practice safe sex. Use condoms and avoid high-risk sexual practices to reduce the spread of sexually transmitted infections (STIs).  You should be screened for STIs, including gonorrhea and chlamydia if:  You are sexually active and are younger than 24 years.  You are older than 24 years, and your health care Trisha Morandi tells you that you are at risk for this type of infection.  Your sexual activity has changed since you were last screened, and you are at an increased risk for chlamydia or gonorrhea. Ask your health care Tekesha Almgren if you are at risk.  If you are at risk of being infected with HIV, it is recommended that you take a prescription medicine daily to prevent HIV infection. This is called pre-exposure prophylaxis (PrEP). You are considered at risk if:  You are a man who has sex with other men (MSM).  You are a heterosexual man who   is sexually active with multiple partners.  You take drugs by injection.  You are sexually active with a partner who has HIV.  Talk with your health care Lakitha Gordy about whether you are at high risk of being infected with HIV. If you choose to begin PrEP, you should first be tested for HIV. You should then be tested every 3 months for as long as you are taking PrEP.  Use sunscreen. Apply sunscreen liberally and repeatedly throughout the day. You should seek shade when your shadow is shorter than you. Protect yourself by wearing long sleeves, pants, a wide-brimmed hat, and sunglasses year round whenever you are outdoors.  Tell your health care Jacalyn Biggs of new moles or changes in moles, especially if there is a change in shape or color. Also, tell your health care Scott Fix if a mole is larger than the size of a pencil eraser.  A one-time screening for abdominal aortic aneurysm (AAA) and surgical repair of large AAAs by ultrasound is recommended for men aged  65-75 years who are current or former smokers.  Stay current with your vaccines (immunizations). Document Released: 06/23/2007 Document Revised: 12/30/2012 Document Reviewed: 05/22/2010 Exit[employer] Patient Information 2015 [employer]. This information is not intended to replace advice given to you by your health care Dajia Gunnels. Make sure you discuss any questions you have with your health care Anyelin Mogle.  

## 2013-06-24 NOTE — Progress Notes (Signed)
Subjective:    Patient ID: Jeffrey Deleon, male    DOB: 1965-10-05, 48 y.o.   MRN: [id number]  HPI Comments: He returns for a CPX and he tells me that he feels well and offers no complaints.     Review of Systems  Constitutional: Negative.  Negative for fever, chills, diaphoresis, appetite change and fatigue.  HENT: Negative.   Eyes: Negative.   Respiratory: Negative.  Negative for apnea, cough, choking, chest tightness, shortness of breath, wheezing and stridor.   Cardiovascular: Negative.  Negative for chest pain, palpitations and leg swelling.  Gastrointestinal: Negative.  Negative for nausea, vomiting, abdominal pain, diarrhea, constipation and blood in stool.  Endocrine: Negative.   Genitourinary: Negative.  Negative for difficulty urinating.  Musculoskeletal: Negative.  Negative for arthralgias, back pain, joint swelling, myalgias and neck pain.  Skin: Negative.   Allergic/Immunologic: Negative.   Neurological: Negative.   Hematological: Negative.  Negative for adenopathy. Does not bruise/bleed easily.  Psychiatric/Behavioral: Negative.        Objective:   Physical Exam  Vitals reviewed. Constitutional: He is oriented to person, place, and time. He appears well-developed and well-nourished. No distress.  HENT:  Head: Normocephalic and atraumatic.  Mouth/Throat: Oropharynx is clear and moist. No oropharyngeal exudate.  Eyes: Conjunctivae are normal. Right eye exhibits no discharge. Left eye exhibits no discharge. No scleral icterus.  Neck: Normal range of motion. Neck supple. No JVD present. No tracheal deviation present. No thyromegaly present.  Cardiovascular: Normal rate, regular rhythm, normal heart sounds and intact distal pulses.  Exam reveals no gallop and no friction rub.   No murmur heard. Pulmonary/Chest: Effort normal and breath sounds normal. No stridor. No respiratory distress. He has no wheezes. He has no rales. He exhibits no tenderness.  Abdominal: Soft.  Bowel sounds are normal. He exhibits no distension and no mass. There is no tenderness. There is no rebound and no guarding. Hernia confirmed negative in the right inguinal area.  Genitourinary: Rectum normal, prostate normal, testes normal and penis normal. Rectal exam shows no external hemorrhoid, no internal hemorrhoid, no fissure, no mass, no tenderness and anal tone normal. Guaiac negative stool. Prostate is not enlarged and not tender. Right testis shows no mass, no swelling and no tenderness. Right testis is descended. Left testis shows no mass, no swelling and no tenderness. Left testis is descended. Circumcised. No penile erythema or penile tenderness. No discharge found.  Musculoskeletal: Normal range of motion. He exhibits no edema and no tenderness.  Lymphadenopathy:    He has no cervical adenopathy.       Right: No inguinal adenopathy present.       Left: No inguinal adenopathy present.  Neurological: He is oriented to person, place, and time.  Skin: Skin is warm and dry. No rash noted. He is not diaphoretic. No erythema. No pallor.  Psychiatric: He has a normal mood and affect. His behavior is normal. Judgment and thought content normal.     Lab Results  Component Value Date   WBC 6.7 03/22/2011   HGB 15.7 03/22/2011   HCT 46.8 03/22/2011   PLT 332.0 03/22/2011   GLUCOSE 93 03/22/2011   CHOL 190 03/22/2011   TRIG 147.0 03/22/2011   HDL 47.10 03/22/2011   LDLCALC 114* 03/22/2011   ALT 17 03/22/2011   AST 18 03/22/2011   NA 139 03/22/2011   K 4.4 03/22/2011   CL 105 03/22/2011   CREATININE 1.4 03/22/2011   BUN 17 03/22/2011   CO2 27  03/22/2011   TSH 0.75 03/22/2011   PSA 0.42 03/22/2011       Assessment & Plan:

## 2013-06-25 NOTE — Assessment & Plan Note (Signed)
Exam done Vaccines were updated and given Labs ordered Pt ed material was given

## 2014-05-04 ENCOUNTER — Ambulatory Visit (INDEPENDENT_AMBULATORY_CARE_PROVIDER_SITE_OTHER): Payer: 59 | Admitting: Internal Medicine

## 2014-05-04 ENCOUNTER — Encounter: Payer: Self-pay | Admitting: Internal Medicine

## 2014-05-04 VITALS — BP 130/98 | HR 92 | Temp 98.6°F | Resp 16 | Ht 68.0 in | Wt 201.0 lb

## 2014-05-04 DIAGNOSIS — I10 Essential (primary) hypertension: Secondary | ICD-10-CM | POA: Diagnosis not present

## 2014-05-04 MED ORDER — NEBIVOLOL HCL 10 MG PO TABS: 10.0000 mg | ORAL_TABLET | Freq: Every day | ORAL | Status: DC

## 2014-05-04 NOTE — Progress Notes (Signed)
Pre visit review using our clinic review tool, if applicable. No additional management support is needed unless otherwise documented below in the visit note. 

## 2014-05-04 NOTE — Patient Instructions (Signed)

## 2014-05-05 NOTE — Progress Notes (Signed)
   Subjective:    Patient ID: Jeffrey Deleon, male    DOB: [birth year], 49 y.o.   MRN: [id number]  HPI Comments: His BP was 156/104 at work 2 weeks ago  Hypertension This is a new problem. The current episode started 1 to 4 weeks ago. The problem is unchanged. The problem is uncontrolled. Associated symptoms include headaches. Pertinent negatives include no anxiety, blurred vision, chest pain, malaise/fatigue, neck pain, orthopnea, palpitations, peripheral edema, PND, shortness of breath or sweats. There are no associated agents to hypertension. Past treatments include nothing. The current treatment provides no improvement. Compliance problems include diet and exercise.       Review of Systems  Constitutional: Negative.  Negative for chills, malaise/fatigue, diaphoresis, appetite change and fatigue.  Eyes: Negative.  Negative for blurred vision.  Respiratory: Negative.  Negative for apnea, cough, choking, chest tightness, shortness of breath, wheezing and stridor.   Cardiovascular: Negative.  Negative for chest pain, palpitations, orthopnea, leg swelling and PND.  Gastrointestinal: Negative.  Negative for nausea, vomiting, abdominal pain, diarrhea, constipation and blood in stool.  Endocrine: Negative.   Genitourinary: Negative.  Negative for dysuria, urgency, frequency, hematuria and decreased urine volume.  Musculoskeletal: Negative.  Negative for neck pain.  Skin: Negative.  Negative for rash.  Allergic/Immunologic: Negative.   Neurological: Positive for headaches. Negative for dizziness.  Hematological: Negative.  Negative for adenopathy. Does not bruise/bleed easily.  Psychiatric/Behavioral: Negative.        Objective:   Physical Exam  Constitutional: He is oriented to person, place, and time. He appears well-developed and well-nourished. No distress.  HENT:  Head: Normocephalic and atraumatic.  Mouth/Throat: Oropharynx is clear and moist. No oropharyngeal exudate.  Eyes:  Conjunctivae are normal. Right eye exhibits no discharge. Left eye exhibits no discharge. No scleral icterus.  Neck: Normal range of motion. Neck supple. No JVD present. No tracheal deviation present. No thyromegaly present.  Cardiovascular: Normal rate, regular rhythm, normal heart sounds and intact distal pulses.  Exam reveals no gallop and no friction rub.   No murmur heard. Pulmonary/Chest: Effort normal and breath sounds normal. No stridor. No respiratory distress. He has no wheezes. He has no rales. He exhibits no tenderness.  Abdominal: Soft. Bowel sounds are normal. He exhibits no distension and no mass. There is no tenderness. There is no rebound and no guarding.  Musculoskeletal: Normal range of motion. He exhibits no edema or tenderness.  Lymphadenopathy:    He has no cervical adenopathy.  Neurological: He is oriented to person, place, and time.  Skin: Skin is warm and dry. No rash noted. He is not diaphoretic. No erythema. No pallor.  Psychiatric: He has a normal mood and affect. His behavior is normal. Judgment and thought content normal.  Vitals reviewed.    Lab Results  Component Value Date   WBC 6.3 06/24/2013   HGB 16.0 06/24/2013   HCT 48.5 06/24/2013   PLT 355.0 06/24/2013   GLUCOSE 88 06/24/2013   CHOL 172 06/24/2013   TRIG 93.0 06/24/2013   HDL 45.30 06/24/2013   LDLCALC 108* 06/24/2013   ALT 16 06/24/2013   AST 17 06/24/2013   NA 140 06/24/2013   K 4.3 06/24/2013   CL 106 06/24/2013   CREATININE 1.1 06/24/2013   BUN 11 06/24/2013   CO2 30 06/24/2013   TSH 0.87 06/24/2013   PSA 0.36 06/24/2013       Assessment & Plan:

## 2014-05-05 NOTE — Assessment & Plan Note (Signed)
He has new onset HTN I do not see any evidence of a secondary cause Will start bystolic He will work on his lifestyle modifications

## 2015-10-14 ENCOUNTER — Ambulatory Visit (HOSPITAL_COMMUNITY)
Admission: EM | Admit: 2015-10-14 | Discharge: 2015-10-14 | Disposition: A | Discharge status: Home / Self Care | Payer: Commercial Managed Care - HMO | Attending: Family Medicine | Admitting: Family Medicine

## 2015-10-14 ENCOUNTER — Ambulatory Visit (INDEPENDENT_AMBULATORY_CARE_PROVIDER_SITE_OTHER): Payer: Commercial Managed Care - HMO

## 2015-10-14 ENCOUNTER — Encounter (HOSPITAL_COMMUNITY): Payer: Self-pay | Admitting: Emergency Medicine

## 2015-10-14 DIAGNOSIS — N23 Unspecified renal colic: Secondary | ICD-10-CM | POA: Diagnosis not present

## 2015-10-14 LAB — POCT URINALYSIS DIP (DEVICE)
BILIRUBIN URINE: NEGATIVE
Glucose, UA: NEGATIVE mg/dL
KETONES UR: NEGATIVE mg/dL
LEUKOCYTES UA: NEGATIVE
NITRITE: NEGATIVE
PH: 5.5 (ref 5.0–8.0)
Protein, ur: NEGATIVE mg/dL
Specific Gravity, Urine: 1.02 (ref 1.005–1.030)
Urobilinogen, UA: 0.2 mg/dL (ref 0.0–1.0)

## 2015-10-14 MED ORDER — INDOMETHACIN 50 MG PO CAPS: 50.0000 mg | ORAL_CAPSULE | Freq: Two times a day (BID) | ORAL | 0 refills | Status: DC

## 2015-10-14 NOTE — ED Provider Notes (Signed)
CSN: [id number]     Arrival date & time 10/14/15  [id number] History   First MD Initiated Contact with Patient 10/14/15 1029     Chief Complaint  Patient presents with  . Abdominal Pain   (Consider location/radiation/quality/duration/timing/severity/associated sxs/prior Treatment) HPI This is a new problem.  Onset 4 am sudden sharp pain in right flank that radiated to the groin. Lasted for a few hours until he urinated and pain suddenly became better.  Sweats, nausea. No fever.  History reviewed. No pertinent past medical history. Past Surgical History:  Procedure Laterality Date  . TOOTH EXTRACTION     Family History  Problem Relation Age of Onset  . Alcohol abuse Neg Hx   . Diabetes Neg Hx   . Early death Neg Hx   . Heart disease Neg Hx   . Hyperlipidemia Neg Hx   . Kidney disease Neg Hx   . Hypertension Neg Hx   . Stroke Neg Hx   . Cancer Father     Pancreatic   Social History  Substance Use Topics  . Smoking status: Former Smoker    Packs/day: 0.50    Years: 30.00    Types: Cigarettes    Quit date: 02/11/2014  . Smokeless tobacco: Not on file  . Alcohol use 2.4 oz/week    2 Cans of beer, 2 Shots of liquor per week    Review of Systems  Denies: HEADACHE, NAUSEA, ABDOMINAL PAIN, CHEST PAIN, CONGESTION, DYSURIA, SHORTNESS OF BREATH  Allergies  Review of patient's allergies indicates no known allergies.  Home Medications   Prior to Admission medications   Medication Sig Start Date End Date Taking? Authorizing Provider  indomethacin (INDOCIN) 50 MG capsule Take 1 capsule (50 mg total) by mouth 2 (two) times daily with a meal. 10/14/15   Konrad Felix, PA  nebivolol (BYSTOLIC) 10 MG tablet Take 1 tablet (10 mg total) by mouth daily. 05/04/14   Janith Lima, MD   Meds Ordered and Administered this Visit  Medications - No data to display  BP 117/81 (BP Location: Left Arm)   Pulse 95   Temp 98.7 F (37.1 C) (Oral)   Resp 12   SpO2 98%  No data found.   Physical  Exam NURSES NOTES AND VITAL SIGNS REVIEWED. CONSTITUTIONAL: Well developed, well nourished, no acute distress HEENT: normocephalic, atraumatic EYES: Conjunctiva normal NECK:normal ROM, supple, no adenopathy PULMONARY:No respiratory distress, normal effort ABDOMINAL: Soft, ND, NT BS+, No CVAT MUSCULOSKELETAL: Normal ROM of all extremities,  SKIN: warm and dry without rash PSYCHIATRIC: Mood and affect, behavior are normal  Urgent Care Course   Clinical Course    Procedures (including critical care time)  Labs Review Labs Reviewed  POCT URINALYSIS DIP (DEVICE) - Abnormal; Notable for the following:       Result Value   Hgb urine dipstick LARGE (*)    All other components within normal limits    Imaging Review Dg Abd 1 View  Result Date: 10/14/2015 CLINICAL DATA:  Sharp pain . EXAM: ABDOMEN - 1 VIEW COMPARISON:  No recent prior . FINDINGS: Soft tissue structures are unremarkable. No bowel distention. No free air. No acute bony abnormality. Degenerative changes both hips . IMPRESSION: No acute or focal abnormality. Electronically Signed   By: Marcello Moores  Register   On: 10/14/2015 11:23     Visual Acuity Review  Right Eye Distance:   Left Eye Distance:   Bilateral Distance:    Right Eye Near:   Left Eye  Near:    Bilateral Near:         MDM   1. Renal colic on right side     Patient is reassured that there are no issues that require transfer to higher level of care at this time or additional tests. Patient is advised to continue home symptomatic treatment. Patient is advised that if there are new or worsening symptoms to attend the emergency department, contact primary care provider, or return to UC. Instructions of care provided discharged home in stable condition.    THIS NOTE WAS GENERATED USING A VOICE RECOGNITION SOFTWARE PROGRAM. ALL REASONABLE EFFORTS  WERE MADE TO PROOFREAD THIS DOCUMENT FOR ACCURACY.  I have verbally reviewed the discharge instructions with  the patient. A printed AVS was given to the patient.  All questions were answered prior to discharge.      Konrad Felix, PA 10/14/15 Alma Aaryanna Hyden, PA 10/14/15 1135

## 2015-10-14 NOTE — Discharge Instructions (Signed)
Drink lots of fluids this weekend.   If you have more pain, you may need to go to the ER for further evaluation.

## 2015-10-14 NOTE — ED Triage Notes (Signed)
The patient presented to the [facility] with a complaint of RLQ abdominal pain that he stated started at 5 am this morning. The patient described the pain as throbbing and lasted for about 4 hours and has subsided to a 1/10 pain scale. The patient reported normal bowel movements.

## 2017-02-19 ENCOUNTER — Encounter: Payer: 59 | Admitting: Internal Medicine

## 2017-10-18 IMAGING — DX DG ABDOMEN 1V
1 series · 1 of 1 positions shown · non-contrast
Comparison: No recent prior .

CLINICAL DATA: Sharp pain .

EXAM:
ABDOMEN - 1 VIEW

[abdomen kub]
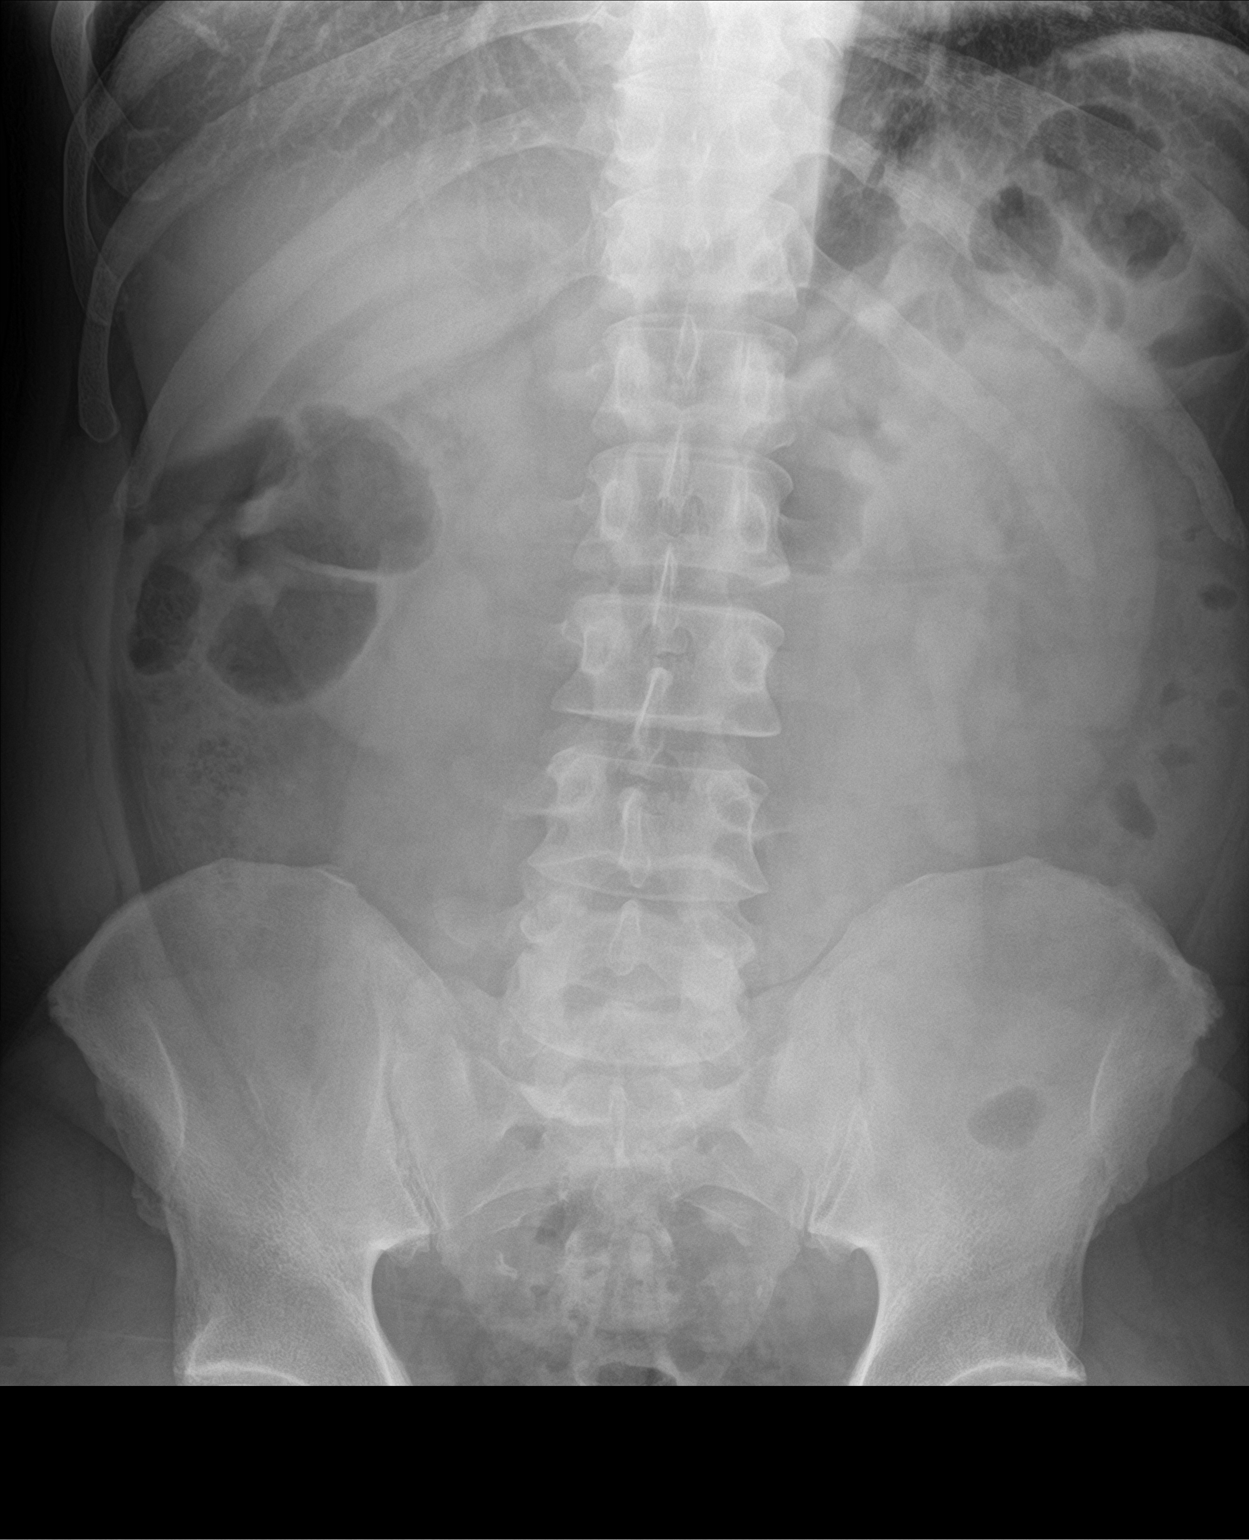

[1 of 1 positions shown; findings below may reference images not displayed]

FINDINGS: Soft tissue structures are unremarkable. No bowel distention. No
free air. No acute bony abnormality. Degenerative changes both hips
.
IMPRESSION: No acute or focal abnormality.

## 2018-09-17 ENCOUNTER — Other Ambulatory Visit: Payer: Self-pay | Admitting: *Deleted

## 2018-09-17 DIAGNOSIS — Z20822 Contact with and (suspected) exposure to covid-19: Secondary | ICD-10-CM

## 2018-09-18 LAB — NOVEL CORONAVIRUS, NAA: SARS-CoV-2, NAA: NOT DETECTED

## 2018-12-10 ENCOUNTER — Other Ambulatory Visit (INDEPENDENT_AMBULATORY_CARE_PROVIDER_SITE_OTHER): Payer: 59

## 2018-12-10 ENCOUNTER — Encounter: Payer: 59 | Admitting: Internal Medicine

## 2018-12-10 ENCOUNTER — Other Ambulatory Visit: Payer: Self-pay

## 2018-12-10 ENCOUNTER — Encounter: Payer: Self-pay | Admitting: Internal Medicine

## 2018-12-10 ENCOUNTER — Ambulatory Visit (INDEPENDENT_AMBULATORY_CARE_PROVIDER_SITE_OTHER): Payer: 59 | Admitting: Internal Medicine

## 2018-12-10 VITALS — BP 140/96 | HR 90 | Temp 98.8°F | Ht 68.0 in | Wt 192.0 lb

## 2018-12-10 DIAGNOSIS — Z1211 Encounter for screening for malignant neoplasm of colon: Secondary | ICD-10-CM

## 2018-12-10 DIAGNOSIS — I10 Essential (primary) hypertension: Secondary | ICD-10-CM

## 2018-12-10 DIAGNOSIS — Z0000 Encounter for general adult medical examination without abnormal findings: Secondary | ICD-10-CM

## 2018-12-10 DIAGNOSIS — E785 Hyperlipidemia, unspecified: Secondary | ICD-10-CM | POA: Diagnosis not present

## 2018-12-10 DIAGNOSIS — E559 Vitamin D deficiency, unspecified: Secondary | ICD-10-CM

## 2018-12-10 LAB — CBC WITH DIFFERENTIAL/PLATELET
Basophils Absolute: 0 10*3/uL (ref 0.0–0.1)
Basophils Relative: 0.5 % (ref 0.0–3.0)
Eosinophils Absolute: 0.3 10*3/uL (ref 0.0–0.7)
Eosinophils Relative: 3.8 % (ref 0.0–5.0)
HCT: 49.7 % (ref 39.0–52.0)
Hemoglobin: 16.9 g/dL (ref 13.0–17.0)
Lymphocytes Relative: 28 % (ref 12.0–46.0)
Lymphs Abs: 2 10*3/uL (ref 0.7–4.0)
MCHC: 34 g/dL (ref 30.0–36.0)
MCV: 89.6 fl (ref 78.0–100.0)
Monocytes Absolute: 0.8 10*3/uL (ref 0.1–1.0)
Monocytes Relative: 10.9 % (ref 3.0–12.0)
Neutro Abs: 4 10*3/uL (ref 1.4–7.7)
Neutrophils Relative %: 56.8 % (ref 43.0–77.0)
Platelets: 411 10*3/uL — ABNORMAL HIGH (ref 150.0–400.0)
RBC: 5.55 Mil/uL (ref 4.22–5.81)
RDW: 15 % (ref 11.5–15.5)
WBC: 7.1 10*3/uL (ref 4.0–10.5)

## 2018-12-10 LAB — HEPATIC FUNCTION PANEL
ALT: 13 U/L (ref 0–53)
AST: 16 U/L (ref 0–37)
Albumin: 4.4 g/dL (ref 3.5–5.2)
Alkaline Phosphatase: 77 U/L (ref 39–117)
Bilirubin, Direct: 0.1 mg/dL (ref 0.0–0.3)
Total Bilirubin: 0.4 mg/dL (ref 0.2–1.2)
Total Protein: 7.1 g/dL (ref 6.0–8.3)

## 2018-12-10 LAB — BASIC METABOLIC PANEL
BUN: 12 mg/dL (ref 6–23)
CO2: 27 mEq/L (ref 19–32)
Calcium: 9.6 mg/dL (ref 8.4–10.5)
Chloride: 106 mEq/L (ref 96–112)
Creatinine, Ser: 1.16 mg/dL (ref 0.40–1.50)
GFR: 79.99 mL/min (ref >60.00)
Glucose, Bld: 97 mg/dL (ref 70–99)
Potassium: 4.3 mEq/L (ref 3.5–5.1)
Sodium: 140 mEq/L (ref 135–145)

## 2018-12-10 LAB — LIPID PANEL
Cholesterol: 191 mg/dL (ref 0–200)
HDL: 40.2 mg/dL (ref >39.00)
LDL Cholesterol: 127 mg/dL — ABNORMAL HIGH (ref 0–99)
NonHDL: 150.35
Total CHOL/HDL Ratio: 5
Triglycerides: 119 mg/dL (ref 0.0–149.0)
VLDL: 23.8 mg/dL (ref 0.0–40.0)

## 2018-12-10 LAB — VITAMIN D 25 HYDROXY (VIT D DEFICIENCY, FRACTURES): VITD: 17.32 ng/mL — ABNORMAL LOW (ref 30.00–100.00)

## 2018-12-10 LAB — TSH: TSH: 0.99 u[IU]/mL (ref 0.35–4.50)

## 2018-12-10 LAB — PSA: PSA: 1.71 ng/mL (ref 0.10–4.00)

## 2018-12-10 MED ORDER — CHOLECALCIFEROL 1.25 MG (50000 UT) PO CAPS: 50000.0000 [IU] | ORAL_CAPSULE | ORAL | 1 refills | Status: AC

## 2018-12-10 MED ORDER — NEBIVOLOL HCL 10 MG PO TABS: 10.0000 mg | ORAL_TABLET | Freq: Every day | ORAL | 0 refills | Status: DC

## 2018-12-10 MED ORDER — ROSUVASTATIN CALCIUM 10 MG PO TABS: 10.0000 mg | ORAL_TABLET | Freq: Every day | ORAL | 1 refills | Status: DC

## 2018-12-10 MED ORDER — ASPIRIN EC 81 MG PO TBEC: 81.0000 mg | DELAYED_RELEASE_TABLET | Freq: Every day | ORAL | 1 refills | Status: AC

## 2018-12-10 NOTE — Progress Notes (Signed)
Subjective:  Patient ID: Jeffrey Deleon, male    DOB: 12/10/1965  Age: 53 y.o. MRN: [id number]  CC: Annual Exam and Hypertension   This visit occurred during the SARS-CoV-2 public health emergency.  Safety protocols were in place, including screening questions prior to the visit, additional usage of staff PPE, and extensive cleaning of exam room while observing appropriate contact time as indicated for disinfecting solutions.    HPI Jeffrey Deleon presents for a CPX.  He has a history of hypertension but has not taken an antihypertensive for years.  He is active and denies any recent episodes of headache, blurred vision, chest pain, shortness of breath, edema, or fatigue.  He continues to smoke about 1/2 pack a day of cigarettes.  He rarely drinks liquor.  Outpatient Medications Prior to Visit  Medication Sig Dispense Refill   indomethacin (INDOCIN) 50 MG capsule Take 1 capsule (50 mg total) by mouth 2 (two) times daily with a meal. 20 capsule 0   nebivolol (BYSTOLIC) 10 MG tablet Take 1 tablet (10 mg total) by mouth daily. 49 tablet 0   No facility-administered medications prior to visit.     ROS Review of Systems  Constitutional: Negative.  Negative for diaphoresis, fatigue and unexpected weight change.  HENT: Negative.   Eyes: Negative for visual disturbance.  Respiratory: Negative for cough, chest tightness, shortness of breath and wheezing.   Cardiovascular: Negative for chest pain, palpitations and leg swelling.  Gastrointestinal: Negative for abdominal pain, constipation, diarrhea, nausea and vomiting.  Endocrine: Negative.   Genitourinary: Negative.  Negative for difficulty urinating, scrotal swelling, testicular pain and urgency.  Musculoskeletal: Negative.  Negative for arthralgias and myalgias.  Skin: Negative.  Negative for color change and pallor.  Neurological: Negative.  Negative for dizziness, facial asymmetry, weakness and headaches.  Hematological: Negative for  adenopathy. Does not bruise/bleed easily.  Psychiatric/Behavioral: Negative.     Objective:  BP (!) 140/96 (BP Location: Left Arm, Patient Position: Sitting, Cuff Size: Large)    Pulse 90    Temp 98.8 F (37.1 C) (Oral)    Ht 5\' 8"  (1.727 m)    Wt 192 lb (87.1 kg)    SpO2 98%    BMI 29.19 kg/m   BP Readings from Last 3 Encounters:  12/10/18 (!) 140/96  10/14/15 117/81  05/04/14 (!) 130/98    Wt Readings from Last 3 Encounters:  12/10/18 192 lb (87.1 kg)  05/04/14 201 lb (91.2 kg)  06/24/13 188 lb (85.3 kg)    Physical Exam Vitals signs reviewed.  Constitutional:      Appearance: Normal appearance.  HENT:     Nose: Nose normal.     Mouth/Throat:     Mouth: Mucous membranes are moist.  Eyes:     General: No scleral icterus.    Conjunctiva/sclera: Conjunctivae normal.  Neck:     Musculoskeletal: Neck supple.  Cardiovascular:     Rate and Rhythm: Normal rate.     Heart sounds: Normal heart sounds. No murmur. No gallop.   Pulmonary:     Effort: Pulmonary effort is normal.     Breath sounds: No stridor. No wheezing, rhonchi or rales.  Abdominal:     General: Abdomen is protuberant. Bowel sounds are normal. There is no distension.     Palpations: Abdomen is soft. There is no hepatomegaly or mass.     Tenderness: There is no abdominal tenderness.     Hernia: No hernia is present. There is no hernia in the  left inguinal area or right inguinal area.  Genitourinary:    Pubic Area: No rash.      Penis: Normal. No discharge, swelling or lesions.      Scrotum/Testes: Normal.        Right: Mass or tenderness not present.        Left: Mass or tenderness not present.     Epididymis:     Right: Normal. Not enlarged.     Left: Normal. Not enlarged.     Prostate: Normal. Not enlarged, not tender and no nodules present.     Rectum: Normal. Guaiac result negative. No mass, tenderness, anal fissure, external hemorrhoid or internal hemorrhoid. Normal anal tone.  Musculoskeletal:  Normal range of motion.     Right lower leg: No edema.     Left lower leg: No edema.  Lymphadenopathy:     Cervical: No cervical adenopathy.     Lower Body: No right inguinal adenopathy. No left inguinal adenopathy.  Skin:    General: Skin is warm and dry.     Coloration: Skin is not pale.  Neurological:     General: No focal deficit present.     Mental Status: He is alert.  Psychiatric:        Mood and Affect: Mood normal.        Behavior: Behavior normal.     Lab Results  Component Value Date   WBC 7.1 12/10/2018   HGB 16.9 12/10/2018   HCT 49.7 12/10/2018   PLT 411.0 (H) 12/10/2018   GLUCOSE 97 12/10/2018   CHOL 191 12/10/2018   TRIG 119.0 12/10/2018   HDL 40.20 12/10/2018   LDLCALC 127 (H) 12/10/2018   ALT 13 12/10/2018   AST 16 12/10/2018   NA 140 12/10/2018   K 4.3 12/10/2018   CL 106 12/10/2018   CREATININE 1.16 12/10/2018   BUN 12 12/10/2018   CO2 27 12/10/2018   TSH 0.99 12/10/2018   PSA 1.71 12/10/2018    Dg Abd 1 View  Result Date: 10/14/2015 CLINICAL DATA:  Sharp pain . EXAM: ABDOMEN - 1 VIEW COMPARISON:  No recent prior . FINDINGS: Soft tissue structures are unremarkable. No bowel distention. No free air. No acute bony abnormality. Degenerative changes both hips . IMPRESSION: No acute or focal abnormality. Electronically Signed   By: Marcello Moores  Register   On: 10/14/2015 11:23    Assessment & Plan:   Jeffrey Deleon was seen today for annual exam and hypertension.  Diagnoses and all orders for this visit:  Routine general medical examination at a health care facility- Exam completed, labs reviewed, vaccines reviewed- He refused a flu vaccine, Cologuard ordered to screen for colon cancer/polyps, patient education was given. -     Lipid panel; Future -     HIV antibody (Reflex); Future -     PSA; Future  Colon cancer screening -     Cologuard  Essential hypertension- He has stage I hypertension that is not adequately well controlled.  His labs are negative for  secondary causes or endorgan damage.  His EKG is negative for LVH.  In addition to lifestyle modifications I recommended that he start taking nebivolol to control his blood pressure. -     CBC with Differential; Future -     Basic metabolic panel; Future -     Hepatic function panel; Future -     TSH; Future -     Urinalysis, Routine w reflex microscopic; Future -     Vitamin D  25 hydroxy; Future -     EKG 12-Lead -     nebivolol (BYSTOLIC) 10 MG tablet; Take 1 tablet (10 mg total) by mouth daily.  Vitamin D deficiency disease -     Cholecalciferol 1.25 MG (50000 UT) capsule; Take 1 capsule (50,000 Units total) by mouth once a week.  Hyperlipidemia with target LDL less than 100- He has an elevated ASCVD risk score so I have asked him to take a baby aspirin daily and a statin for CV risk reduction. -     aspirin EC 81 MG tablet; Take 1 tablet (81 mg total) by mouth daily. -     rosuvastatin (CRESTOR) 10 MG tablet; Take 1 tablet (10 mg total) by mouth daily.   I have discontinued Demontrez Degraffenreid's nebivolol and indomethacin. I am also having him start on nebivolol, Cholecalciferol, aspirin EC, and rosuvastatin.  Meds ordered this encounter  Medications   nebivolol (BYSTOLIC) 10 MG tablet    Sig: Take 1 tablet (10 mg total) by mouth daily.    Dispense:  84 tablet    Refill:  0   Cholecalciferol 1.25 MG (50000 UT) capsule    Sig: Take 1 capsule (50,000 Units total) by mouth once a week.    Dispense:  12 capsule    Refill:  1   aspirin EC 81 MG tablet    Sig: Take 1 tablet (81 mg total) by mouth daily.    Dispense:  90 tablet    Refill:  1   rosuvastatin (CRESTOR) 10 MG tablet    Sig: Take 1 tablet (10 mg total) by mouth daily.    Dispense:  90 tablet    Refill:  1     Follow-up: Return in about 2 months (around 02/10/2019).  Scarlette Calico, MD

## 2018-12-10 NOTE — Patient Instructions (Signed)

## 2018-12-11 LAB — HIV ANTIBODY (ROUTINE TESTING W REFLEX): HIV 1&2 Ab, 4th Generation: NONREACTIVE

## 2018-12-16 ENCOUNTER — Encounter: Payer: Self-pay | Admitting: Internal Medicine

## 2018-12-16 ENCOUNTER — Other Ambulatory Visit (INDEPENDENT_AMBULATORY_CARE_PROVIDER_SITE_OTHER): Payer: 59

## 2018-12-16 DIAGNOSIS — I10 Essential (primary) hypertension: Secondary | ICD-10-CM

## 2018-12-16 LAB — URINALYSIS, ROUTINE W REFLEX MICROSCOPIC
Bilirubin Urine: NEGATIVE
Hgb urine dipstick: NEGATIVE
Ketones, ur: NEGATIVE
Leukocytes,Ua: NEGATIVE
Nitrite: NEGATIVE
RBC / HPF: NONE SEEN (ref 0–?)
Specific Gravity, Urine: 1.02 (ref 1.000–1.030)
Total Protein, Urine: NEGATIVE
Urine Glucose: NEGATIVE
Urobilinogen, UA: 0.2 (ref 0.0–1.0)
pH: 7 (ref 5.0–8.0)

## 2019-01-13 LAB — COLOGUARD: Cologuard: NEGATIVE

## 2019-01-13 LAB — FECAL OCCULT BLOOD, GUAIAC: Fecal Occult Blood: NEGATIVE

## 2019-01-21 ENCOUNTER — Encounter: Payer: Self-pay | Admitting: Internal Medicine

## 2019-01-21 ENCOUNTER — Telehealth: Payer: Self-pay

## 2019-01-21 NOTE — Telephone Encounter (Signed)
LVM for pt to call back.   RE: Cologuard result was negative. Will be ordered again in 3 years.   -May inform pt of same if you are comfortable doing so.

## 2019-08-09 ENCOUNTER — Encounter: Payer: Self-pay | Admitting: Internal Medicine

## 2019-08-10 ENCOUNTER — Other Ambulatory Visit: Payer: Self-pay | Admitting: Internal Medicine

## 2019-08-10 DIAGNOSIS — E785 Hyperlipidemia, unspecified: Secondary | ICD-10-CM

## 2019-11-25 ENCOUNTER — Other Ambulatory Visit: Payer: Self-pay | Admitting: Internal Medicine

## 2019-11-25 ENCOUNTER — Encounter: Payer: Self-pay | Admitting: Internal Medicine

## 2019-11-25 DIAGNOSIS — E785 Hyperlipidemia, unspecified: Secondary | ICD-10-CM

## 2019-11-26 MED ORDER — ROSUVASTATIN CALCIUM 10 MG PO TABS: 10.0000 mg | ORAL_TABLET | Freq: Every day | ORAL | 0 refills | Status: DC

## 2019-12-14 ENCOUNTER — Encounter: Payer: 59 | Admitting: Internal Medicine

## 2019-12-30 ENCOUNTER — Ambulatory Visit (INDEPENDENT_AMBULATORY_CARE_PROVIDER_SITE_OTHER): Payer: 59 | Admitting: Internal Medicine

## 2019-12-30 ENCOUNTER — Encounter: Payer: Self-pay | Admitting: Internal Medicine

## 2019-12-30 ENCOUNTER — Other Ambulatory Visit: Payer: Self-pay

## 2019-12-30 VITALS — BP 122/78 | HR 100 | Temp 98.0°F | Resp 16 | Ht 68.0 in | Wt 198.0 lb

## 2019-12-30 DIAGNOSIS — Z1159 Encounter for screening for other viral diseases: Secondary | ICD-10-CM | POA: Insufficient documentation

## 2019-12-30 DIAGNOSIS — D75839 Thrombocytosis, unspecified: Secondary | ICD-10-CM | POA: Diagnosis not present

## 2019-12-30 DIAGNOSIS — Z0000 Encounter for general adult medical examination without abnormal findings: Secondary | ICD-10-CM | POA: Diagnosis not present

## 2019-12-30 DIAGNOSIS — I10 Essential (primary) hypertension: Secondary | ICD-10-CM | POA: Diagnosis not present

## 2019-12-30 DIAGNOSIS — E559 Vitamin D deficiency, unspecified: Secondary | ICD-10-CM | POA: Diagnosis not present

## 2019-12-30 DIAGNOSIS — Z0001 Encounter for general adult medical examination with abnormal findings: Secondary | ICD-10-CM | POA: Insufficient documentation

## 2019-12-30 LAB — BASIC METABOLIC PANEL
BUN: 12 mg/dL (ref 6–23)
CO2: 27 mEq/L (ref 19–32)
Calcium: 9.8 mg/dL (ref 8.4–10.5)
Chloride: 103 mEq/L (ref 96–112)
Creatinine, Ser: 1.04 mg/dL (ref 0.40–1.50)
GFR: 82.23 mL/min (ref >60.00)
Glucose, Bld: 81 mg/dL (ref 70–99)
Potassium: 4.4 mEq/L (ref 3.5–5.1)
Sodium: 138 mEq/L (ref 135–145)

## 2019-12-30 LAB — URINALYSIS, ROUTINE W REFLEX MICROSCOPIC
Bilirubin Urine: NEGATIVE
Hgb urine dipstick: NEGATIVE
Ketones, ur: NEGATIVE
Leukocytes,Ua: NEGATIVE
Nitrite: NEGATIVE
Specific Gravity, Urine: 1.02 (ref 1.000–1.030)
Total Protein, Urine: NEGATIVE
Urine Glucose: NEGATIVE
Urobilinogen, UA: 1 (ref 0.0–1.0)
pH: 8.5 — AB (ref 5.0–8.0)

## 2019-12-30 LAB — LIPID PANEL
Cholesterol: 115 mg/dL (ref 0–200)
HDL: 41.6 mg/dL (ref >39.00)
LDL Cholesterol: 50 mg/dL (ref 0–99)
NonHDL: 73.65
Total CHOL/HDL Ratio: 3
Triglycerides: 118 mg/dL (ref 0.0–149.0)
VLDL: 23.6 mg/dL (ref 0.0–40.0)

## 2019-12-30 LAB — HEPATIC FUNCTION PANEL
ALT: 27 U/L (ref 0–53)
AST: 23 U/L (ref 0–37)
Albumin: 4.5 g/dL (ref 3.5–5.2)
Alkaline Phosphatase: 77 U/L (ref 39–117)
Bilirubin, Direct: 0.1 mg/dL (ref 0.0–0.3)
Total Bilirubin: 0.5 mg/dL (ref 0.2–1.2)
Total Protein: 7.3 g/dL (ref 6.0–8.3)

## 2019-12-30 LAB — PSA: PSA: 0.59 ng/mL (ref 0.10–4.00)

## 2019-12-30 LAB — FERRITIN: Ferritin: 324.9 ng/mL — ABNORMAL HIGH (ref 22.0–322.0)

## 2019-12-30 LAB — TSH: TSH: 1.04 u[IU]/mL (ref 0.35–4.50)

## 2019-12-30 LAB — VITAMIN D 25 HYDROXY (VIT D DEFICIENCY, FRACTURES): VITD: 51.2 ng/mL (ref 30.00–100.00)

## 2019-12-30 LAB — IRON: Iron: 94 ug/dL (ref 42–165)

## 2019-12-30 NOTE — Progress Notes (Signed)
Subjective:  Patient ID: Jeffrey Deleon, male    DOB: 1966/01/01  Age: 54 y.o. MRN: [id number]  CC: Annual Exam, Hyperlipidemia, and Hypertension  This visit occurred during the SARS-CoV-2 public health emergency.  Safety protocols were in place, including screening questions prior to the visit, additional usage of staff PPE, and extensive cleaning of exam room while observing appropriate contact time as indicated for disinfecting solutions.    HPI Jeffrey Deleon presents for a CPX.  He is not taking nebivolol.  He is controlling his blood pressure with lifestyle modifications.  He denies any recent episodes of headache, blurred vision, chest pain, shortness of breath, diaphoresis, edema, or fatigue.  He has a history of thrombocytosis.  He denies episodes of bleeding or bruising.  He denies night sweats, lymphadenopathy, rash, fevers, or chills.  Outpatient Medications Prior to Visit  Medication Sig Dispense Refill  . aspirin EC 81 MG tablet Take 1 tablet (81 mg total) by mouth daily. 90 tablet 1  . Cholecalciferol 1.25 MG (50000 UT) capsule Take 1 capsule (50,000 Units total) by mouth once a week. 12 capsule 1  . rosuvastatin (CRESTOR) 10 MG tablet Take 1 tablet (10 mg total) by mouth daily. 30 tablet 0  . nebivolol (BYSTOLIC) 10 MG tablet Take 1 tablet (10 mg total) by mouth daily. 84 tablet 0   No facility-administered medications prior to visit.    ROS Review of Systems  Constitutional: Negative.  Negative for appetite change, diaphoresis, fatigue and unexpected weight change.  HENT: Negative.   Eyes: Negative.   Respiratory: Negative for cough, chest tightness, shortness of breath and wheezing.   Cardiovascular: Negative for chest pain, palpitations and leg swelling.  Gastrointestinal: Negative.  Negative for abdominal pain, blood in stool, constipation, diarrhea, nausea and vomiting.  Endocrine: Negative.   Genitourinary: Negative.  Negative for difficulty urinating, dysuria,  penile swelling, scrotal swelling and testicular pain.  Musculoskeletal: Negative.  Negative for arthralgias and myalgias.  Skin: Negative.  Negative for color change and rash.  Neurological: Negative.  Negative for dizziness, weakness, light-headedness, numbness and headaches.  Hematological: Negative for adenopathy. Does not bruise/bleed easily.  Psychiatric/Behavioral: Negative.     Objective:  BP 122/78   Pulse 100   Temp 98 F (36.7 C) (Oral)   Resp 16   Ht 5\' 8"  (1.727 m)   Wt 198 lb (89.8 kg)   SpO2 97%   BMI 30.11 kg/m   BP Readings from Last 3 Encounters:  12/30/19 122/78  12/10/18 (!) 140/96  10/14/15 117/81    Wt Readings from Last 3 Encounters:  12/30/19 198 lb (89.8 kg)  12/10/18 192 lb (87.1 kg)  05/04/14 201 lb (91.2 kg)    Physical Exam Vitals reviewed.  Constitutional:      Appearance: Normal appearance.  HENT:     Nose: Nose normal.     Mouth/Throat:     Mouth: Mucous membranes are moist.  Eyes:     General: No scleral icterus.    Conjunctiva/sclera: Conjunctivae normal.  Cardiovascular:     Rate and Rhythm: Normal rate and regular rhythm.     Heart sounds: No murmur heard.   Pulmonary:     Effort: Pulmonary effort is normal.     Breath sounds: No stridor. No wheezing, rhonchi or rales.  Abdominal:     General: Abdomen is flat. Bowel sounds are normal. There is no distension.     Palpations: Abdomen is soft. There is no hepatomegaly, splenomegaly or mass.  Tenderness: There is no abdominal tenderness.     Hernia: There is no hernia in the left inguinal area or right inguinal area.  Genitourinary:    Pubic Area: No rash.      Penis: Normal and circumcised. No discharge, swelling or lesions.      Testes: Normal.     Epididymis:     Right: Normal. Not inflamed or enlarged. No mass.     Left: Normal. Not inflamed or enlarged. No mass.  Musculoskeletal:        General: Normal range of motion.     Cervical back: Neck supple.     Right  lower leg: No edema.     Left lower leg: No edema.  Lymphadenopathy:     Cervical: No cervical adenopathy.     Lower Body: No right inguinal adenopathy. No left inguinal adenopathy.  Skin:    General: Skin is warm and dry.     Coloration: Skin is not pale.  Neurological:     General: No focal deficit present.     Mental Status: He is alert and oriented to person, place, and time.  Psychiatric:        Mood and Affect: Mood normal.        Behavior: Behavior normal.     Lab Results  Component Value Date   WBC 5.9 01/07/2020   HGB 16.1 01/07/2020   HCT 47.4 01/07/2020   PLT 391.0 01/07/2020   GLUCOSE 81 12/30/2019   CHOL 115 12/30/2019   TRIG 118.0 12/30/2019   HDL 41.60 12/30/2019   LDLCALC 50 12/30/2019   ALT 27 12/30/2019   AST 23 12/30/2019   NA 138 12/30/2019   K 4.4 12/30/2019   CL 103 12/30/2019   CREATININE 1.04 12/30/2019   BUN 12 12/30/2019   CO2 27 12/30/2019   TSH 1.04 12/30/2019   PSA 0.59 12/30/2019    DG Abd 1 View  Result Date: 10/14/2015 CLINICAL DATA:  Sharp pain . EXAM: ABDOMEN - 1 VIEW COMPARISON:  No recent prior . FINDINGS: Soft tissue structures are unremarkable. No bowel distention. No free air. No acute bony abnormality. Degenerative changes both hips . IMPRESSION: No acute or focal abnormality. Electronically Signed   By: Marcello Moores  Register   On: 10/14/2015 11:23    Assessment & Plan:   Wilburt was seen today for annual exam, hyperlipidemia and hypertension.  Diagnoses and all orders for this visit:  Essential hypertension- His blood pressure is adequately well controlled with lifestyle modifications.  Labs are negative for secondary causes or endorgan damage.  Antihypertensive therapy is not indicated. -     Cancel: CBC with Differential/Platelet; Future -     Basic metabolic panel; Future -     Hepatic function panel; Future -     TSH; Future -     Urinalysis, Routine w reflex microscopic; Future -     VITAMIN D 25 Hydroxy (Vit-D  Deficiency, Fractures); Future -     VITAMIN D 25 Hydroxy (Vit-D Deficiency, Fractures) -     Urinalysis, Routine w reflex microscopic -     TSH -     Hepatic function panel -     Basic metabolic panel  Encounter for general adult medical examination with abnormal findings- Exam completed, labs reviewed, vaccines reviewed and updated, cancer screenings are up-to-date, patient education was given. -     Lipid panel; Future -     PSA; Future -     Hepatitis C antibody;  Future -     Hepatitis C antibody -     PSA -     Lipid panel  Vitamin D deficiency disease- His vitamin D level is normal now. -     VITAMIN D 25 Hydroxy (Vit-D Deficiency, Fractures); Future -     VITAMIN D 25 Hydroxy (Vit-D Deficiency, Fractures)  Thrombocytosis- His platelet count is normal now.  His iron and ferritin are also normal.  Will continue to monitor this. -     Cancel: CBC with Differential/Platelet; Future -     Iron; Future -     Ferritin; Future -     Hepatic function panel; Future -     Hepatic function panel -     Iron -     Ferritin -     CBC with Differential/Platelet; Future  Need for hepatitis C screening test -     Hepatitis C antibody; Future -     Hepatitis C antibody   I have discontinued Kavir Pasquarelli's nebivolol. I am also having him maintain his Cholecalciferol, aspirin EC, and rosuvastatin.  No orders of the defined types were placed in this encounter.    Follow-up: Return in about 6 months (around 06/29/2020).  Scarlette Calico, MD

## 2019-12-30 NOTE — Patient Instructions (Signed)

## 2019-12-31 LAB — HEPATITIS C ANTIBODY
Hepatitis C Ab: NONREACTIVE
SIGNAL TO CUT-OFF: 0.01 (ref <1.00)

## 2020-01-05 ENCOUNTER — Encounter: Payer: Self-pay | Admitting: Internal Medicine

## 2020-01-06 ENCOUNTER — Other Ambulatory Visit: Payer: Self-pay | Admitting: Internal Medicine

## 2020-01-06 DIAGNOSIS — E785 Hyperlipidemia, unspecified: Secondary | ICD-10-CM

## 2020-01-07 ENCOUNTER — Other Ambulatory Visit (INDEPENDENT_AMBULATORY_CARE_PROVIDER_SITE_OTHER): Payer: 59

## 2020-01-07 ENCOUNTER — Other Ambulatory Visit: Payer: Self-pay

## 2020-01-07 DIAGNOSIS — D75839 Thrombocytosis, unspecified: Secondary | ICD-10-CM

## 2020-01-07 LAB — CBC WITH DIFFERENTIAL/PLATELET
Basophils Absolute: 0 10*3/uL (ref 0.0–0.1)
Basophils Relative: 0.6 % (ref 0.0–3.0)
Eosinophils Absolute: 0.2 10*3/uL (ref 0.0–0.7)
Eosinophils Relative: 2.9 % (ref 0.0–5.0)
HCT: 47.4 % (ref 39.0–52.0)
Hemoglobin: 16.1 g/dL (ref 13.0–17.0)
Lymphocytes Relative: 30.7 % (ref 12.0–46.0)
Lymphs Abs: 1.8 10*3/uL (ref 0.7–4.0)
MCHC: 34 g/dL (ref 30.0–36.0)
MCV: 89.1 fl (ref 78.0–100.0)
Monocytes Absolute: 0.6 10*3/uL (ref 0.1–1.0)
Monocytes Relative: 10.1 % (ref 3.0–12.0)
Neutro Abs: 3.3 10*3/uL (ref 1.4–7.7)
Neutrophils Relative %: 55.7 % (ref 43.0–77.0)
Platelets: 391 10*3/uL (ref 150.0–400.0)
RBC: 5.33 Mil/uL (ref 4.22–5.81)
RDW: 14.8 % (ref 11.5–15.5)
WBC: 5.9 10*3/uL (ref 4.0–10.5)

## 2020-01-11 ENCOUNTER — Other Ambulatory Visit: Payer: Self-pay | Admitting: Internal Medicine

## 2020-01-11 DIAGNOSIS — E785 Hyperlipidemia, unspecified: Secondary | ICD-10-CM

## 2020-01-12 ENCOUNTER — Encounter: Payer: Self-pay | Admitting: Internal Medicine

## 2020-01-13 ENCOUNTER — Other Ambulatory Visit: Payer: Self-pay | Admitting: Internal Medicine

## 2020-01-13 DIAGNOSIS — E785 Hyperlipidemia, unspecified: Secondary | ICD-10-CM

## 2020-01-13 MED ORDER — ROSUVASTATIN CALCIUM 10 MG PO TABS
10.0000 mg | ORAL_TABLET | Freq: Every day | ORAL | 1 refills | Status: DC
Start: 2020-01-13 — End: 2020-08-15

## 2020-08-15 ENCOUNTER — Other Ambulatory Visit: Payer: Self-pay | Admitting: Internal Medicine

## 2020-08-15 DIAGNOSIS — E785 Hyperlipidemia, unspecified: Secondary | ICD-10-CM

## 2020-11-08 ENCOUNTER — Other Ambulatory Visit: Payer: Self-pay

## 2020-11-08 ENCOUNTER — Ambulatory Visit (INDEPENDENT_AMBULATORY_CARE_PROVIDER_SITE_OTHER): Payer: 59 | Admitting: Internal Medicine

## 2020-11-08 ENCOUNTER — Encounter: Payer: Self-pay | Admitting: Internal Medicine

## 2020-11-08 VITALS — BP 118/74 | HR 80 | Temp 98.0°F | Resp 16 | Ht 68.0 in | Wt 197.0 lb

## 2020-11-08 DIAGNOSIS — R220 Localized swelling, mass and lump, head: Secondary | ICD-10-CM

## 2020-11-08 NOTE — Progress Notes (Signed)
Subjective:  Patient ID: Jeffrey Deleon, male    DOB: 1965-07-01  Age: 55 y.o. MRN: [id number]  CC: Follow-up  This visit occurred during the SARS-CoV-2 public health emergency.  Safety protocols were in place, including screening questions prior to the visit, additional usage of staff PPE, and extensive cleaning of exam room while observing appropriate contact time as indicated for disinfecting solutions.    HPI Jeffrey Deleon presents for f/up - He complains that there is an asx growth that has developed over his left posterior scalp over the last month. He refused a flu vax today.  Outpatient Medications Prior to Visit  Medication Sig Dispense Refill   aspirin EC 81 MG tablet Take 1 tablet (81 mg total) by mouth daily. 90 tablet 1   Cholecalciferol 1.25 MG (50000 UT) capsule Take 1 capsule (50,000 Units total) by mouth once a week. 12 capsule 1   rosuvastatin (CRESTOR) 10 MG tablet Take 1 tablet by mouth once daily 90 tablet 0   No facility-administered medications prior to visit.    ROS Review of Systems  All other systems reviewed and are negative.  Objective:  BP 118/74 (BP Location: Left Arm, Patient Position: Sitting, Cuff Size: Large)   Pulse 80   Temp 98 F (36.7 C) (Oral)   Resp 16   Ht 5\' 8"  (1.727 m)   Wt 197 lb (89.4 kg)   SpO2 97%   BMI 29.95 kg/m   BP Readings from Last 3 Encounters:  11/08/20 118/74  12/30/19 122/78  12/10/18 (!) 140/96    Wt Readings from Last 3 Encounters:  11/08/20 197 lb (89.4 kg)  12/30/19 198 lb (89.8 kg)  12/10/18 192 lb (87.1 kg)    Physical Exam Vitals reviewed.  HENT:     Head:      Mouth/Throat:     Mouth: Mucous membranes are moist.  Eyes:     General: No scleral icterus.    Conjunctiva/sclera: Conjunctivae normal.  Cardiovascular:     Rate and Rhythm: Normal rate and regular rhythm.     Heart sounds: No murmur heard. Pulmonary:     Breath sounds: No stridor. No wheezing, rhonchi or rales.  Abdominal:      General: Abdomen is flat.     Palpations: There is no mass.     Tenderness: There is no abdominal tenderness.  Musculoskeletal:        General: Normal range of motion.     Cervical back: Neck supple.     Right lower leg: No edema.     Left lower leg: No edema.  Lymphadenopathy:     Cervical: No cervical adenopathy.  Skin:    General: Skin is warm and dry.  Neurological:     General: No focal deficit present.     Mental Status: He is alert.    Lab Results  Component Value Date   WBC 5.9 01/07/2020   HGB 16.1 01/07/2020   HCT 47.4 01/07/2020   PLT 391.0 01/07/2020   GLUCOSE 81 12/30/2019   CHOL 115 12/30/2019   TRIG 118.0 12/30/2019   HDL 41.60 12/30/2019   LDLCALC 50 12/30/2019   ALT 27 12/30/2019   AST 23 12/30/2019   NA 138 12/30/2019   K 4.4 12/30/2019   CL 103 12/30/2019   CREATININE 1.04 12/30/2019   BUN 12 12/30/2019   CO2 27 12/30/2019   TSH 1.04 12/30/2019   PSA 0.59 12/30/2019    DG Abd 1 View  Result Date:  10/14/2015 CLINICAL DATA:  Sharp pain . EXAM: ABDOMEN - 1 VIEW COMPARISON:  No recent prior . FINDINGS: Soft tissue structures are unremarkable. No bowel distention. No free air. No acute bony abnormality. Degenerative changes both hips . IMPRESSION: No acute or focal abnormality. Electronically Signed   By: Marcello Moores  Register   On: 10/14/2015 11:23    Assessment & Plan:   Jeffrey Deleon was seen today for follow-up.  Diagnoses and all orders for this visit:  Mass of scalp- This is likely a sebaceous cyst. It bothers him so I have recommended that he see a Psychiatric nurse. -     Ambulatory referral to Plastic Surgery  I am having Jeffrey Deleon maintain his Cholecalciferol, aspirin EC, and rosuvastatin.  No orders of the defined types were placed in this encounter.    Follow-up: No follow-ups on file.  Scarlette Calico, MD

## 2020-11-14 ENCOUNTER — Encounter: Payer: Self-pay | Admitting: Internal Medicine

## 2020-12-08 ENCOUNTER — Ambulatory Visit: Payer: 59 | Admitting: Internal Medicine

## 2020-12-12 ENCOUNTER — Ambulatory Visit: Payer: 59 | Admitting: Plastic Surgery

## 2020-12-12 ENCOUNTER — Other Ambulatory Visit: Payer: Self-pay

## 2020-12-12 ENCOUNTER — Encounter: Payer: Self-pay | Admitting: Plastic Surgery

## 2020-12-12 VITALS — BP 151/96 | HR 66 | Resp 97 | Ht 68.0 in | Wt 195.2 lb

## 2020-12-12 DIAGNOSIS — D489 Neoplasm of uncertain behavior, unspecified: Secondary | ICD-10-CM | POA: Diagnosis not present

## 2020-12-12 NOTE — Progress Notes (Signed)
   Referring Provider Janith Lima, MD [address] [place],  [place] [address]   CC:  Left scalp mass   Jeffrey Deleon is an 55 y.o. male.  HPI: 55 year old male with a left scalp mass.  He has noticed this since October.  The lesion has not changed in size or shape recently.  No Known Allergies  Outpatient Encounter Medications as of 12/12/2020  Medication Sig   aspirin EC 81 MG tablet Take 1 tablet (81 mg total) by mouth daily.   Cholecalciferol 1.25 MG (50000 UT) capsule Take 1 capsule (50,000 Units total) by mouth once a week.   rosuvastatin (CRESTOR) 10 MG tablet Take 1 tablet by mouth once daily   No facility-administered encounter medications on file as of 12/12/2020.     No past medical history on file.  Past Surgical History:  Procedure Laterality Date   TOOTH EXTRACTION      Family History  Problem Relation Age of Onset   Alcohol abuse Neg Hx    Diabetes Neg Hx    Early death Neg Hx    Heart disease Neg Hx    Hyperlipidemia Neg Hx    Kidney disease Neg Hx    Hypertension Neg Hx    Stroke Neg Hx    Cancer Father        Pancreatic    Social History   Social History Narrative   Caffienated drinks-yes   Seat belt use often-yes   Regular Exercise-no   Smoke alarm in the home-yes   Firearms/guns in the home-yes   History of physical abuse-no                 Review of Systems General: Denies fevers, chills, weight loss CV: Denies chest pain, shortness of breath, palpitations   Physical Exam Vitals with BMI 12/12/2020 11/08/2020 12/30/2019  Height 5\' 8"  5\' 8"  5\' 8"   Weight 195 lbs 3 oz 197 lbs 198 lbs  BMI 29.69 37.62 83.15  Systolic 176 160 737  Diastolic 96 74 78  Pulse 66 80 100    General:  No acute distress,  Alert and oriented, Non-Toxic, Normal speech and affect Heent:  left scalp 2.1 x 2.1 cm cystic mass  Assessment/Plan Excision of left scalp mass under local indicated.  Smoking cessation preferred but we can proceed with this  minor procedure.  Lennice Sites 12/12/2020, 1:46 PM

## 2020-12-23 ENCOUNTER — Encounter: Payer: Self-pay | Admitting: Plastic Surgery

## 2020-12-23 ENCOUNTER — Other Ambulatory Visit: Payer: Self-pay

## 2020-12-23 ENCOUNTER — Ambulatory Visit: Payer: 59 | Admitting: Plastic Surgery

## 2020-12-23 ENCOUNTER — Other Ambulatory Visit (HOSPITAL_COMMUNITY)
Admission: RE | Admit: 2020-12-23 | Discharge: 2020-12-23 | Disposition: A | Discharge status: Home / Self Care | Payer: 59 | Source: Ambulatory Visit | Attending: Plastic Surgery | Admitting: Plastic Surgery

## 2020-12-23 VITALS — BP 135/89 | HR 83

## 2020-12-23 DIAGNOSIS — D489 Neoplasm of uncertain behavior, unspecified: Secondary | ICD-10-CM | POA: Insufficient documentation

## 2020-12-23 NOTE — Progress Notes (Signed)
Operative Note   DATE OF OPERATION: 12/23/2020  LOCATION:    SURGICAL DEPARTMENT: Plastic Surgery  PREOPERATIVE DIAGNOSES:  left posterior scalp lesion  POSTOPERATIVE DIAGNOSES:  same  PROCEDURE:  Excision of 1.5 cm left posterior scalp lesion 1.5 cm intermediate closure left posterior scalp  SURGEON: Timesha Cervantez P. Lakitha Gordy, MD  ANESTHESIA:  Local  COMPLICATIONS: None.   INDICATIONS FOR PROCEDURE:  The patient, Jeffrey Deleon is a 55 y.o. male born on 1965/05/03, is here for treatment of left scalp lesion. MRN: [id number]  CONSENT:  Informed consent was obtained directly from the patient. Risks, benefits and alternatives were fully discussed. Specific risks including but not limited to bleeding, infection, hematoma, seroma, scarring, pain, infection, wound healing problems, and need for further surgery were all discussed. The patient did have an ample opportunity to have questions answered to satisfaction.   DESCRIPTION OF PROCEDURE:  Local anesthesia was administered. The patient's operative site was prepped and draped in a sterile fashion. A time out was performed and all information was confirmed to be correct.  The lesion was excised with a 15 blade.  Scissor was used for additional sharp and blunt dissection.  Hemostasis was obtained with bovie.  Circumferential undermining was performed and the skin was advanced and closed in layers with interrupted buried Monocryl sutures for deeper tissues and Prolene for the skin.  The lesion excised measured 1.5 cm, and the total length of closure measured 1.5cm.   The lesion was sent to pathology.  The patient tolerated the procedure well.  There were no complications.

## 2020-12-26 LAB — SURGICAL PATHOLOGY

## 2021-01-03 ENCOUNTER — Ambulatory Visit: Payer: 59 | Admitting: Internal Medicine

## 2021-01-06 ENCOUNTER — Ambulatory Visit: Payer: 59 | Admitting: Surgical

## 2021-01-06 ENCOUNTER — Other Ambulatory Visit: Payer: Self-pay

## 2021-01-06 DIAGNOSIS — D489 Neoplasm of uncertain behavior, unspecified: Secondary | ICD-10-CM | POA: Diagnosis not present

## 2021-01-06 NOTE — Progress Notes (Signed)
Patient is a 55 year old male here for follow-up after excision of scalp lesion with Dr. Erin Hearing on 12/23/2020. Pathology report reviewed with patient. Patient reports that he is doing well.  Has some questions about when he can get a haircut.  Physical exam: General: No acute distress, well-developed, well-nourished, normal mood and affect Skin: Left posterior scalp incision is healing well, Prolene sutures in place.  No surrounding erythema or cellulitic changes.  No wounds noted.  No subcutaneous fluid collection noted.  A/P: Recommend waiting a few days until getting a haircut. Prolene sutures were removed, patient tolerated this well. Recommend following up as needed.  No signs of infection on exam.  Call with questions or concerns.

## 2021-01-26 ENCOUNTER — Telehealth: Payer: Self-pay | Admitting: Internal Medicine

## 2021-01-26 NOTE — Telephone Encounter (Signed)
Type of form received (Home Health, FMLA, disability, handicapped placard, Surgical clearance) FMLA  Form placed in (E-fax folder, Provider mailbox) Provider mailbox  Additional instructions from the patient (mail, fax, notify by phone when complete) Fax  Things to remember: [place] office: If form received in person, remind patient that forms take 7-10 business days CMA should attach charge sheet and put on Supervisor's desk

## 2021-01-27 NOTE — Telephone Encounter (Signed)
Forms have been signed and faxed back  Copy sent to scan Copy sent to pt Original filed with CMA

## 2021-01-27 NOTE — Telephone Encounter (Signed)
Forms have been completed and given to PCP to review and sign.  ?

## 2021-02-18 ENCOUNTER — Other Ambulatory Visit: Payer: Self-pay | Admitting: Internal Medicine

## 2021-02-18 DIAGNOSIS — E785 Hyperlipidemia, unspecified: Secondary | ICD-10-CM

## 2021-07-04 ENCOUNTER — Other Ambulatory Visit: Payer: Self-pay | Admitting: Internal Medicine

## 2021-07-04 DIAGNOSIS — E785 Hyperlipidemia, unspecified: Secondary | ICD-10-CM

## 2021-07-05 ENCOUNTER — Encounter: Payer: Self-pay | Admitting: Internal Medicine

## 2021-07-05 ENCOUNTER — Other Ambulatory Visit: Payer: Self-pay | Admitting: Internal Medicine

## 2021-07-05 DIAGNOSIS — E785 Hyperlipidemia, unspecified: Secondary | ICD-10-CM

## 2022-01-24 ENCOUNTER — Ambulatory Visit (INDEPENDENT_AMBULATORY_CARE_PROVIDER_SITE_OTHER): Payer: 59 | Admitting: Internal Medicine

## 2022-01-24 ENCOUNTER — Other Ambulatory Visit: Payer: Self-pay | Admitting: *Deleted

## 2022-01-24 ENCOUNTER — Encounter: Payer: Self-pay | Admitting: Internal Medicine

## 2022-01-24 VITALS — BP 146/94 | HR 100 | Temp 97.9°F | Ht 68.0 in | Wt 203.0 lb

## 2022-01-24 DIAGNOSIS — Z0000 Encounter for general adult medical examination without abnormal findings: Secondary | ICD-10-CM

## 2022-01-24 DIAGNOSIS — I10 Essential (primary) hypertension: Secondary | ICD-10-CM

## 2022-01-24 DIAGNOSIS — E785 Hyperlipidemia, unspecified: Secondary | ICD-10-CM | POA: Diagnosis not present

## 2022-01-24 DIAGNOSIS — F17210 Nicotine dependence, cigarettes, uncomplicated: Secondary | ICD-10-CM

## 2022-01-24 DIAGNOSIS — Z23 Encounter for immunization: Secondary | ICD-10-CM

## 2022-01-24 DIAGNOSIS — Z87891 Personal history of nicotine dependence: Secondary | ICD-10-CM

## 2022-01-24 DIAGNOSIS — R9431 Abnormal electrocardiogram [ECG] [EKG]: Secondary | ICD-10-CM

## 2022-01-24 DIAGNOSIS — Z0001 Encounter for general adult medical examination with abnormal findings: Secondary | ICD-10-CM

## 2022-01-24 DIAGNOSIS — Z720 Tobacco use: Secondary | ICD-10-CM

## 2022-01-24 DIAGNOSIS — Z122 Encounter for screening for malignant neoplasm of respiratory organs: Secondary | ICD-10-CM

## 2022-01-24 DIAGNOSIS — Z1211 Encounter for screening for malignant neoplasm of colon: Secondary | ICD-10-CM

## 2022-01-24 LAB — URINALYSIS, ROUTINE W REFLEX MICROSCOPIC
Bilirubin Urine: NEGATIVE
Hgb urine dipstick: NEGATIVE
Ketones, ur: NEGATIVE
Leukocytes,Ua: NEGATIVE
Nitrite: NEGATIVE
RBC / HPF: NONE SEEN (ref 0–?)
Specific Gravity, Urine: 1.025 (ref 1.000–1.030)
Total Protein, Urine: NEGATIVE
Urine Glucose: NEGATIVE
Urobilinogen, UA: 0.2 (ref 0.0–1.0)
pH: 6.5 (ref 5.0–8.0)

## 2022-01-24 LAB — HEPATIC FUNCTION PANEL
ALT: 19 U/L (ref 0–53)
AST: 19 U/L (ref 0–37)
Albumin: 4.6 g/dL (ref 3.5–5.2)
Alkaline Phosphatase: 85 U/L (ref 39–117)
Bilirubin, Direct: 0.1 mg/dL (ref 0.0–0.3)
Total Bilirubin: 0.4 mg/dL (ref 0.2–1.2)
Total Protein: 7.7 g/dL (ref 6.0–8.3)

## 2022-01-24 LAB — BASIC METABOLIC PANEL
BUN: 14 mg/dL (ref 6–23)
CO2: 30 mEq/L (ref 19–32)
Calcium: 10.1 mg/dL (ref 8.4–10.5)
Chloride: 101 mEq/L (ref 96–112)
Creatinine, Ser: 1.15 mg/dL (ref 0.40–1.50)
GFR: 71.83 mL/min (ref >60.00)
Glucose, Bld: 88 mg/dL (ref 70–99)
Potassium: 4.3 mEq/L (ref 3.5–5.1)
Sodium: 140 mEq/L (ref 135–145)

## 2022-01-24 LAB — CBC WITH DIFFERENTIAL/PLATELET
Basophils Absolute: 0 10*3/uL (ref 0.0–0.1)
Basophils Relative: 0.5 % (ref 0.0–3.0)
Eosinophils Absolute: 0.1 10*3/uL (ref 0.0–0.7)
Eosinophils Relative: 2.2 % (ref 0.0–5.0)
HCT: 49.4 % (ref 39.0–52.0)
Hemoglobin: 16.8 g/dL (ref 13.0–17.0)
Lymphocytes Relative: 31.8 % (ref 12.0–46.0)
Lymphs Abs: 2 10*3/uL (ref 0.7–4.0)
MCHC: 34.1 g/dL (ref 30.0–36.0)
MCV: 89.9 fl (ref 78.0–100.0)
Monocytes Absolute: 0.6 10*3/uL (ref 0.1–1.0)
Monocytes Relative: 9.5 % (ref 3.0–12.0)
Neutro Abs: 3.5 10*3/uL (ref 1.4–7.7)
Neutrophils Relative %: 56 % (ref 43.0–77.0)
Platelets: 450 10*3/uL — ABNORMAL HIGH (ref 150.0–400.0)
RBC: 5.49 Mil/uL (ref 4.22–5.81)
RDW: 14.8 % (ref 11.5–15.5)
WBC: 6.3 10*3/uL (ref 4.0–10.5)

## 2022-01-24 LAB — LIPID PANEL
Cholesterol: 184 mg/dL (ref 0–200)
HDL: 47.4 mg/dL (ref >39.00)
LDL Cholesterol: 118 mg/dL — ABNORMAL HIGH (ref 0–99)
NonHDL: 136.25
Total CHOL/HDL Ratio: 4
Triglycerides: 92 mg/dL (ref 0.0–149.0)
VLDL: 18.4 mg/dL (ref 0.0–40.0)

## 2022-01-24 LAB — TSH: TSH: 1.69 u[IU]/mL (ref 0.35–5.50)

## 2022-01-24 LAB — PSA: PSA: 0.55 ng/mL (ref 0.10–4.00)

## 2022-01-24 MED ORDER — CARVEDILOL PHOSPHATE ER 10 MG PO CP24: 10.0000 mg | ORAL_CAPSULE | Freq: Every day | ORAL | 0 refills | Status: DC

## 2022-01-24 MED ORDER — ROSUVASTATIN CALCIUM 20 MG PO TABS: 20.0000 mg | ORAL_TABLET | Freq: Every day | ORAL | 1 refills | Status: DC

## 2022-01-24 MED ORDER — INDAPAMIDE 1.25 MG PO TABS: 1.2500 mg | ORAL_TABLET | Freq: Every day | ORAL | 0 refills | Status: DC

## 2022-01-24 NOTE — Progress Notes (Signed)
Subjective:  Patient ID: Jeffrey Deleon, male    DOB: 01/13/1965  Age: 57 y.o. MRN: [id number]  CC: Annual Exam and Hypertension   HPI Jeffrey Deleon presents for a CPX and f/up -  He is active and denies chest pain, shortness of breath, diaphoresis, or edema.  His blood pressure has not been well-controlled and he has had some headaches recently.  He denies blurred vision.  Outpatient Medications Prior to Visit  Medication Sig Dispense Refill   aspirin EC 81 MG tablet Take 1 tablet (81 mg total) by mouth daily. 90 tablet 1   Cholecalciferol 1.25 MG (50000 UT) capsule Take 1 capsule (50,000 Units total) by mouth once a week. 12 capsule 1   rosuvastatin (CRESTOR) 10 MG tablet Take 1 tablet by mouth once daily 90 tablet 0   No facility-administered medications prior to visit.    ROS Review of Systems  Constitutional: Negative.  Negative for diaphoresis and fatigue.  HENT: Negative.    Eyes:  Negative for visual disturbance.  Respiratory: Negative.  Negative for cough, chest tightness, shortness of breath and wheezing.   Cardiovascular:  Negative for chest pain, palpitations and leg swelling.  Gastrointestinal:  Negative for abdominal pain, diarrhea, nausea and vomiting.  Genitourinary: Negative.  Negative for difficulty urinating, dysuria and hematuria.  Musculoskeletal: Negative.   Skin: Negative.   Neurological:  Positive for headaches. Negative for dizziness, weakness and numbness.  Hematological:  Negative for adenopathy. Does not bruise/bleed easily.  Psychiatric/Behavioral: Negative.      Objective:  BP (!) 146/94 (BP Location: Right Arm, Patient Position: Sitting, Cuff Size: Large)   Pulse 100   Temp 97.9 F (36.6 C) (Oral)   Ht '5\' 8"'$  (1.727 m)   Wt 203 lb (92.1 kg)   SpO2 98%   BMI 30.87 kg/m   BP Readings from Last 3 Encounters:  01/24/22 (!) 146/94  12/23/20 135/89  12/12/20 (!) 151/96    Wt Readings from Last 3 Encounters:  01/24/22 203 lb (92.1 kg)   12/12/20 195 lb 3.2 oz (88.5 kg)  11/08/20 197 lb (89.4 kg)    Physical Exam Vitals reviewed.  Constitutional:      Appearance: He is not ill-appearing.  HENT:     Nose: Nose normal.     Mouth/Throat:     Mouth: Mucous membranes are moist.  Eyes:     General: No scleral icterus.    Conjunctiva/sclera: Conjunctivae normal.  Cardiovascular:     Rate and Rhythm: Normal rate and regular rhythm.     Heart sounds: No murmur heard.    Comments: EKG- NSR, 90 bpm Incomplete RBBB - new Septal infarct pattern - new No LVH  Pulmonary:     Effort: Pulmonary effort is normal.     Breath sounds: No stridor. No wheezing, rhonchi or rales.  Abdominal:     General: Abdomen is flat.     Palpations: There is no mass.     Tenderness: There is no abdominal tenderness. There is no guarding.     Hernia: No hernia is present. There is no hernia in the left inguinal area or right inguinal area.  Genitourinary:    Pubic Area: No rash.      Penis: Normal and circumcised.      Testes: Normal.     Epididymis:     Right: Normal.     Left: Normal.     Prostate: Normal. Not enlarged, not tender and no nodules present.  Rectum: Normal. Guaiac result negative. No mass, tenderness, anal fissure, external hemorrhoid or internal hemorrhoid. Normal anal tone.  Musculoskeletal:     Cervical back: Neck supple.     Right lower leg: No edema.     Left lower leg: No edema.  Lymphadenopathy:     Lower Body: No right inguinal adenopathy. No left inguinal adenopathy.  Skin:    General: Skin is warm and dry.  Neurological:     General: No focal deficit present.     Mental Status: He is alert. Mental status is at baseline.  Psychiatric:        Mood and Affect: Mood normal.        Behavior: Behavior normal.     Lab Results  Component Value Date   WBC 6.3 01/24/2022   HGB 16.8 01/24/2022   HCT 49.4 01/24/2022   PLT 450.0 (H) 01/24/2022   GLUCOSE 88 01/24/2022   CHOL 184 01/24/2022   TRIG 92.0  01/24/2022   HDL 47.40 01/24/2022   LDLCALC 118 (H) 01/24/2022   ALT 19 01/24/2022   AST 19 01/24/2022   NA 140 01/24/2022   K 4.3 01/24/2022   CL 101 01/24/2022   CREATININE 1.15 01/24/2022   BUN 14 01/24/2022   CO2 30 01/24/2022   TSH 1.69 01/24/2022   PSA 0.55 01/24/2022    No results found.  Assessment & Plan:   Dondrell was seen today for annual exam and hypertension.  Diagnoses and all orders for this visit:  Encounter for general adult medical examination with abnormal findings- Exam completed, labs reviewed, vaccines reviewed, cancer screenings addressed, patient education was given. -     Lipid panel; Future -     PSA; Future -     PSA -     Lipid panel  Primary hypertension- His blood pressure is not well-controlled and he is symptomatic.  Will start carvedilol and indapamide.  He has new EKG changes so I ordered an MPI to evaluate for ischemia/infarct. -     CBC with Differential/Platelet; Future -     Hepatic function panel; Future -     TSH; Future -     Urinalysis, Routine w reflex microscopic; Future -     Basic metabolic panel; Future -     EKG 12-Lead -     Basic metabolic panel -     Urinalysis, Routine w reflex microscopic -     TSH -     Hepatic function panel -     CBC with Differential/Platelet -     carvedilol (COREG CR) 10 MG 24 hr capsule; Take 1 capsule (10 mg total) by mouth daily. -     indapamide (LOZOL) 1.25 MG tablet; Take 1 tablet (1.25 mg total) by mouth daily.  Tobacco abuse -     Ambulatory Referral for Lung Cancer Scre  Screen for colon cancer -     Cologuard  Abnormal EKG -     MYOCARDIAL PERFUSION IMAGING; Future -     Cardiac Stress Test: Informed Consent Details: Physician/Practitioner Attestation; Transcribe to consent form and obtain patient signature; Future  Abnormal electrocardiogram -     MYOCARDIAL PERFUSION IMAGING; Future -     Cardiac Stress Test: Informed Consent Details: Physician/Practitioner Attestation;  Transcribe to consent form and obtain patient signature; Future  Hyperlipidemia with target LDL less than 100- Will start a statin for cardiovascular risk reduction. -     rosuvastatin (CRESTOR) 20 MG tablet; Take 1 tablet (  20 mg total) by mouth daily.  Dyslipidemia, goal LDL below 70  Other orders -     Zoster Recombinant (Shingrix )   I have discontinued Kionte Andujar's rosuvastatin. I am also having him start on carvedilol, indapamide, and rosuvastatin. Additionally, I am having him maintain his Cholecalciferol and aspirin EC.  Meds ordered this encounter  Medications   carvedilol (COREG CR) 10 MG 24 hr capsule    Sig: Take 1 capsule (10 mg total) by mouth daily.    Dispense:  90 capsule    Refill:  0   indapamide (LOZOL) 1.25 MG tablet    Sig: Take 1 tablet (1.25 mg total) by mouth daily.    Dispense:  90 tablet    Refill:  0   rosuvastatin (CRESTOR) 20 MG tablet    Sig: Take 1 tablet (20 mg total) by mouth daily.    Dispense:  90 tablet    Refill:  1     Follow-up: Return in about 3 months (around 04/25/2022).  Scarlette Calico, MD

## 2022-01-24 NOTE — Patient Instructions (Signed)
Health Maintenance, Male Adopting a healthy lifestyle and getting preventive care are important in promoting health and wellness. Ask your health care provider about: The right schedule for you to have regular tests and exams. Things you can do on your own to prevent diseases and keep yourself healthy. What should I know about diet, weight, and exercise? Eat a healthy diet  Eat a diet that includes plenty of vegetables, fruits, low-fat dairy products, and lean protein. Do not eat a lot of foods that are high in solid fats, added sugars, or sodium. Maintain a healthy weight Body mass index (BMI) is a measurement that can be used to identify possible weight problems. It estimates body fat based on height and weight. Your health care provider can help determine your BMI and help you achieve or maintain a healthy weight. Get regular exercise Get regular exercise. This is one of the most important things you can do for your health. Most adults should: Exercise for at least 150 minutes each week. The exercise should increase your heart rate and make you sweat (moderate-intensity exercise). Do strengthening exercises at least twice a week. This is in addition to the moderate-intensity exercise. Spend less time sitting. Even light physical activity can be beneficial. Watch cholesterol and blood lipids Have your blood tested for lipids and cholesterol at 56 years of age, then have this test every 5 years. You may need to have your cholesterol levels checked more often if: Your lipid or cholesterol levels are high. You are older than 56 years of age. You are at high risk for heart disease. What should I know about cancer screening? Many types of cancers can be detected early and may often be prevented. Depending on your health history and family history, you may need to have cancer screening at various ages. This may include screening for: Colorectal cancer. Prostate cancer. Skin cancer. Lung  cancer. What should I know about heart disease, diabetes, and high blood pressure? Blood pressure and heart disease High blood pressure causes heart disease and increases the risk of stroke. This is more likely to develop in people who have high blood pressure readings or are overweight. Talk with your health care provider about your target blood pressure readings. Have your blood pressure checked: Every 3-5 years if you are 18-39 years of age. Every year if you are 40 years old or older. If you are between the ages of 65 and 75 and are a current or former smoker, ask your health care provider if you should have a one-time screening for abdominal aortic aneurysm (AAA). Diabetes Have regular diabetes screenings. This checks your fasting blood sugar level. Have the screening done: Once every three years after age 45 if you are at a normal weight and have a low risk for diabetes. More often and at a younger age if you are overweight or have a high risk for diabetes. What should I know about preventing infection? Hepatitis B If you have a higher risk for hepatitis B, you should be screened for this virus. Talk with your health care provider to find out if you are at risk for hepatitis B infection. Hepatitis C Blood testing is recommended for: Everyone born from 1945 through 1965. Anyone with known risk factors for hepatitis C. Sexually transmitted infections (STIs) You should be screened each year for STIs, including gonorrhea and chlamydia, if: You are sexually active and are younger than 56 years of age. You are older than 56 years of age and your   health care provider tells you that you are at risk for this type of infection. Your sexual activity has changed since you were last screened, and you are at increased risk for chlamydia or gonorrhea. Ask your health care provider if you are at risk. Ask your health care provider about whether you are at high risk for HIV. Your health care provider  may recommend a prescription medicine to help prevent HIV infection. If you choose to take medicine to prevent HIV, you should first get tested for HIV. You should then be tested every 3 months for as long as you are taking the medicine. Follow these instructions at home: Alcohol use Do not drink alcohol if your health care provider tells you not to drink. If you drink alcohol: Limit how much you have to 0-2 drinks a day. Know how much alcohol is in your drink. In the [place] one drink equals one 12 oz bottle of beer (355 mL), one 5 oz glass of wine (148 mL), or one 1 oz glass of hard liquor (44 mL). Lifestyle Do not use any products that contain nicotine or tobacco. These products include cigarettes, chewing tobacco, and vaping devices, such as e-cigarettes. If you need help quitting, ask your health care provider. Do not use street drugs. Do not share needles. Ask your health care provider for help if you need support or information about quitting drugs. General instructions Schedule regular health, dental, and eye exams. Stay current with your vaccines. Tell your health care provider if: You often feel depressed. You have ever been abused or do not feel safe at home. Summary Adopting a healthy lifestyle and getting preventive care are important in promoting health and wellness. Follow your health care provider's instructions about healthy diet, exercising, and getting tested or screened for diseases. Follow your health care provider's instructions on monitoring your cholesterol and blood pressure. This information is not intended to replace advice given to you by your health care provider. Make sure you discuss any questions you have with your health care provider. Document Revised: 05/16/2020 Document Reviewed: 05/16/2020 Elsevier Patient Education  2023 [employer] Inc.  

## 2022-01-28 ENCOUNTER — Encounter: Payer: Self-pay | Admitting: Internal Medicine

## 2022-02-06 LAB — COLOGUARD: COLOGUARD: NEGATIVE

## 2022-03-05 ENCOUNTER — Ambulatory Visit (INDEPENDENT_AMBULATORY_CARE_PROVIDER_SITE_OTHER): Payer: 59 | Admitting: Primary Care

## 2022-03-05 ENCOUNTER — Encounter: Payer: Self-pay | Admitting: Primary Care

## 2022-03-05 DIAGNOSIS — Z87891 Personal history of nicotine dependence: Secondary | ICD-10-CM

## 2022-03-05 NOTE — Patient Instructions (Signed)
Thank you for participating in the [facility]. It was our pleasure to meet you today. We will call you with the results of your scan within the next few days. Your scan will be assigned a Lung RADS category score by the physicians reading the scans.  This Lung RADS score determines follow up scanning.  See below for description of categories, and follow up screening recommendations. We will be in touch to schedule your follow up screening annually or based on recommendations of our providers. We will fax a copy of your scan results to your Primary Care Physician, or the physician who referred you to the program, to ensure they have the results. Please call the office if you have any questions or concerns regarding your scanning experience or results.  Our office number is [phone]. Please speak with Denise Phelps, RN. , or  Denise Buckner RN, They are  our Lung Cancer Screening RN.'s If They are unavailable when you call, Please leave a message on the voice mail. We will return your call at our earliest convenience.This voice mail is monitored several times a day.  Remember, if your scan is normal, we will scan you annually as long as you continue to meet the criteria for the program. (Age 50-80, Current smoker or smoker who has quit within the last 15 years). If you are a smoker, remember, quitting is the single most powerful action that you can take to decrease your risk of lung cancer and other pulmonary, breathing related problems. We know quitting is hard, and we are here to help.  Please let us know if there is anything we can do to help you meet your goal of quitting. If you are a former smoker, congratulations. We are proud of you! Remain smoke free! Remember you can refer friends or family members through the number above.  We will screen them to make sure they meet criteria for the program. Thank you for helping us take better care of you by  participating in Lung Screening.  You can receive free nicotine replacement therapy ( patches, gum or mints) by calling 1-800-QUIT NOW. Please call so we can get you on the path to becoming  a non-smoker. I know it is hard, but you can do this!  Lung RADS Categories:  Lung RADS 1: no nodules or definitely non-concerning nodules.  Recommendation is for a repeat annual scan in 12 months.  Lung RADS 2:  nodules that are non-concerning in appearance and behavior with a very low likelihood of becoming an active cancer. Recommendation is for a repeat annual scan in 12 months.  Lung RADS 3: nodules that are probably non-concerning , includes nodules with a low likelihood of becoming an active cancer.  Recommendation is for a 6-month repeat screening scan. Often noted after an upper respiratory illness. We will be in touch to make sure you have no questions, and to schedule your 6-month scan.  Lung RADS 4 A: nodules with concerning findings, recommendation is most often for a follow up scan in 3 months or additional testing based on our provider's assessment of the scan. We will be in touch to make sure you have no questions and to schedule the recommended 3 month follow up scan.  Lung RADS 4 B:  indicates findings that are concerning. We will be in touch with you to schedule additional diagnostic testing based on our provider's  assessment of the scan.  Other options for assistance in smoking cessation (   As covered by your insurance benefits)  Hypnosis for smoking cessation  [employer] [phone]  Acupuncture for smoking cessation  [facility] [phone]   

## 2022-03-05 NOTE — Progress Notes (Signed)
Virtual Visit via Telephone Note  I connected with Jeffrey Deleon on 03/05/22 at  4:00 PM EST by telephone and verified that I am speaking with the correct person using two identifiers.  Location: Patient: Home Provider: Office    I discussed the limitations, risks, security and privacy concerns of performing an evaluation and management service by telephone and the availability of in person appointments. I also discussed with the patient that there may be a patient responsible charge related to this service. The patient expressed understanding and agreed to proceed.    Shared Decision Making Visit Lung Cancer Screening Program [phone])   Eligibility: Age 57 y.o. Pack Years Smoking History Calculation 20 (# packs/per year x # years smoked) Recent History of coughing up blood  no Unexplained weight loss? no ( >Than 15 pounds within the last 6 months ) Prior History Lung / other cancer no (Diagnosis within the last 5 years already requiring surveillance chest CT Scans). Smoking Status Former Smoker Former Smokers: Years since quit: < 1 year  Quit Date: January 17th, 2024   Visit Components: Discussion included one or more decision making aids. yes Discussion included risk/benefits of screening. yes Discussion included potential follow up diagnostic testing for abnormal scans. yes Discussion included meaning and risk of over diagnosis. yes Discussion included meaning and risk of False Positives. yes Discussion included meaning of total radiation exposure. yes  Counseling Included: Importance of adherence to annual lung cancer LDCT screening. yes Impact of comorbidities on ability to participate in the program. yes Ability and willingness to under diagnostic treatment. yes  Smoking Cessation Counseling: Current Smokers:  Discussed importance of smoking cessation. yes Information about tobacco cessation classes and interventions provided to patient. yes Patient provided with  "ticket" for LDCT Scan. NA Symptomatic Patient. no  Counseling(Intermediate counseling: > three minutes) [id number] Diagnosis Code: Tobacco Use Z72.0 Asymptomatic Patient yes  Counseling (Intermediate counseling: > three minutes counseling) [id number] Former Smokers:  Discussed the importance of maintaining cigarette abstinence. yes Diagnosis Code: Personal History of Nicotine Dependence. [id number] Information about tobacco cessation classes and interventions provided to patient. Yes Patient provided with "ticket" for LDCT Scan. NA Written Order for Lung Cancer Screening with LDCT placed in Epic. Yes (CT Chest Lung Cancer Screening Low Dose W/O CM) [id number] Z12.2-Screening of respiratory organs Z87.891-Personal history of nicotine dependence   I have spent 25 minutes of face to face/ virtual visit   time with Mr Deleon discussing the risks and benefits of lung cancer screening. We viewed / discussed a power point together that explained in detail the above noted topics. We paused at intervals to allow for questions to be asked and answered to ensure understanding.We discussed that the single most powerful action that he can take to decrease his risk of developing lung cancer is to quit smoking. We discussed whether or not he is ready to commit to setting a quit date. We discussed options for tools to aid in quitting smoking including nicotine replacement therapy, non-nicotine medications, support groups, Quit Smart classes, and behavior modification. We discussed that often times setting smaller, more achievable goals, such as eliminating 1 cigarette a day for a week and then 2 cigarettes a day for a week can be helpful in slowly decreasing the number of cigarettes smoked. This allows for a sense of accomplishment as well as providing a clinical benefit. I provided him with smoking cessation  information  with contact information for community resources, classes, free nicotine replacement therapy, and access to  mobile apps, text messaging, and on-line smoking cessation help. I have also provided him the office contact information in the event he needs to contact me, or the screening staff. We discussed the time and location of the scan, and that either Doroteo Glassman RN, Joella Prince, RN  or I will call / send a letter with the results within 24-72 hours of receiving them. The patient verbalized understanding of all of  the above and had no further questions upon leaving the office. They have my contact information in the event they have any further questions.  I spent 3-5 minutes counseling on smoking cessation and the health risks of continued tobacco abuse.  I explained to the patient that there has been a high incidence of coronary artery disease noted on these exams. I explained that this is a non-gated exam therefore degree or severity cannot be determined. This patient IS on statin therapy. I have asked the patient to follow-up with their PCP regarding any incidental finding of coronary artery disease and management with diet or medication as their PCP  feels is clinically indicated. The patient verbalized understanding of the above and had no further questions upon completion of the visit.     Martyn Ehrich, NP

## 2022-03-06 ENCOUNTER — Ambulatory Visit
Admission: RE | Admit: 2022-03-06 | Discharge: 2022-03-06 | Disposition: A | Discharge status: Home / Self Care | Payer: 59 | Source: Ambulatory Visit | Attending: Acute Care | Admitting: Acute Care

## 2022-03-06 DIAGNOSIS — Z122 Encounter for screening for malignant neoplasm of respiratory organs: Secondary | ICD-10-CM

## 2022-03-06 DIAGNOSIS — Z87891 Personal history of nicotine dependence: Secondary | ICD-10-CM

## 2022-03-06 DIAGNOSIS — F17210 Nicotine dependence, cigarettes, uncomplicated: Secondary | ICD-10-CM

## 2022-03-08 ENCOUNTER — Other Ambulatory Visit: Payer: Self-pay

## 2022-03-08 DIAGNOSIS — Z87891 Personal history of nicotine dependence: Secondary | ICD-10-CM

## 2022-03-08 DIAGNOSIS — Z122 Encounter for screening for malignant neoplasm of respiratory organs: Secondary | ICD-10-CM

## 2022-04-08 ENCOUNTER — Other Ambulatory Visit: Payer: Self-pay | Admitting: Internal Medicine

## 2022-04-08 DIAGNOSIS — I10 Essential (primary) hypertension: Secondary | ICD-10-CM

## 2022-04-09 MED ORDER — CARVEDILOL PHOSPHATE ER 10 MG PO CP24: 10.0000 mg | ORAL_CAPSULE | Freq: Every day | ORAL | 0 refills | Status: DC

## 2022-04-11 ENCOUNTER — Encounter: Payer: Self-pay | Admitting: Internal Medicine

## 2022-04-17 ENCOUNTER — Telehealth (HOSPITAL_COMMUNITY): Payer: Self-pay | Admitting: *Deleted

## 2022-04-17 ENCOUNTER — Other Ambulatory Visit: Payer: Self-pay | Admitting: Internal Medicine

## 2022-04-17 DIAGNOSIS — I10 Essential (primary) hypertension: Secondary | ICD-10-CM

## 2022-04-17 NOTE — Telephone Encounter (Signed)
Patient given detailed instructions per Myocardial Perfusion Study Information Sheet for the test on 04/18/22 Patient notified to arrive 15 minutes early and that it is imperative to arrive on time for appointment to keep from having the test rescheduled.  If you need to cancel or reschedule your appointment, please call the office within 24 hours of your appointment. . Patient verbalized understanding. Jaeleen Inzunza Jacqueline, RN   

## 2022-04-18 ENCOUNTER — Ambulatory Visit (HOSPITAL_COMMUNITY): Payer: 59 | Attending: Cardiovascular Disease

## 2022-04-18 DIAGNOSIS — R9431 Abnormal electrocardiogram [ECG] [EKG]: Secondary | ICD-10-CM

## 2022-04-18 LAB — MYOCARDIAL PERFUSION IMAGING
LV dias vol: 69 mL (ref 62–150)
LV sys vol: 32 mL
Nuc Stress EF: 64 %
Peak HR: 101 {beats}/min
Rest HR: 87 {beats}/min
Rest Nuclear Isotope Dose: 10.2 mCi
SDS: 0
SRS: 0
SSS: 0
ST Depression (mm): 0 mm
Stress Nuclear Isotope Dose: 32.5 mCi
TID: 0.94

## 2022-04-18 MED ORDER — TECHNETIUM TC 99M TETROFOSMIN IV KIT
10.2000 | PACK | Freq: Once | INTRAVENOUS | Status: AC | PRN
  Administered 2022-04-18: 10.2 via INTRAVENOUS

## 2022-04-18 MED ORDER — TECHNETIUM TC 99M TETROFOSMIN IV KIT
32.5000 | PACK | Freq: Once | INTRAVENOUS | Status: AC | PRN
  Administered 2022-04-18: 32.5 via INTRAVENOUS

## 2022-04-18 MED ORDER — REGADENOSON 0.4 MG/5ML IV SOLN
0.4000 mg | Freq: Once | INTRAVENOUS | Status: AC
Start: 2022-04-18 — End: 2022-04-18
  Administered 2022-04-18: 0.4 mg via INTRAVENOUS

## 2022-04-25 ENCOUNTER — Other Ambulatory Visit: Payer: Self-pay | Admitting: Internal Medicine

## 2022-04-25 DIAGNOSIS — I10 Essential (primary) hypertension: Secondary | ICD-10-CM

## 2022-04-26 MED ORDER — INDAPAMIDE 1.25 MG PO TABS
1.2500 mg | ORAL_TABLET | Freq: Every day | ORAL | 0 refills | Status: DC
Start: 2022-04-26 — End: 2022-06-10

## 2022-04-26 MED ORDER — CARVEDILOL PHOSPHATE ER 10 MG PO CP24
10.0000 mg | ORAL_CAPSULE | Freq: Every day | ORAL | 0 refills | Status: DC
Start: 2022-04-26 — End: 2022-05-24

## 2022-05-08 ENCOUNTER — Encounter: Payer: Self-pay | Admitting: Internal Medicine

## 2022-05-15 ENCOUNTER — Telehealth: Payer: Self-pay | Admitting: Acute Care

## 2022-05-15 NOTE — Telephone Encounter (Signed)
Connected with patient by phone to review results of recent LDCT.  No suspicious findings for lung cancer.  Atherosclerosis and mild emphysema.  Patient is on statin medication.  Small right lung nodule noted but no concern for cancer.  Patient acknowledged understanding and had no questions.  Plan for annual LDCT. Order has been placed.

## 2022-05-24 ENCOUNTER — Other Ambulatory Visit: Payer: Self-pay | Admitting: Internal Medicine

## 2022-05-24 DIAGNOSIS — I10 Essential (primary) hypertension: Secondary | ICD-10-CM

## 2022-05-25 MED ORDER — CARVEDILOL PHOSPHATE ER 10 MG PO CP24
10.0000 mg | ORAL_CAPSULE | Freq: Every day | ORAL | 0 refills | Status: DC
Start: 2022-05-25 — End: 2022-06-10

## 2022-05-28 ENCOUNTER — Other Ambulatory Visit: Payer: Self-pay | Admitting: Internal Medicine

## 2022-05-28 DIAGNOSIS — I10 Essential (primary) hypertension: Secondary | ICD-10-CM

## 2022-05-29 ENCOUNTER — Encounter: Payer: Self-pay | Admitting: Internal Medicine

## 2022-06-06 ENCOUNTER — Ambulatory Visit: Payer: 59 | Admitting: Internal Medicine

## 2022-06-06 VITALS — BP 128/88 | HR 92 | Temp 98.7°F | Ht 68.0 in | Wt 201.0 lb

## 2022-06-06 DIAGNOSIS — D75839 Thrombocytosis, unspecified: Secondary | ICD-10-CM | POA: Diagnosis not present

## 2022-06-06 DIAGNOSIS — I10 Essential (primary) hypertension: Secondary | ICD-10-CM

## 2022-06-06 DIAGNOSIS — E785 Hyperlipidemia, unspecified: Secondary | ICD-10-CM | POA: Diagnosis not present

## 2022-06-06 LAB — CBC WITH DIFFERENTIAL/PLATELET
Basophils Absolute: 0 10*3/uL (ref 0.0–0.1)
Basophils Relative: 0.4 % (ref 0.0–3.0)
Eosinophils Absolute: 0.3 10*3/uL (ref 0.0–0.7)
Eosinophils Relative: 3.9 % (ref 0.0–5.0)
HCT: 48.8 % (ref 39.0–52.0)
Hemoglobin: 16 g/dL (ref 13.0–17.0)
Lymphocytes Relative: 31.3 % (ref 12.0–46.0)
Lymphs Abs: 2.1 10*3/uL (ref 0.7–4.0)
MCHC: 32.9 g/dL (ref 30.0–36.0)
MCV: 90.6 fl (ref 78.0–100.0)
Monocytes Absolute: 0.8 10*3/uL (ref 0.1–1.0)
Monocytes Relative: 12.3 % — ABNORMAL HIGH (ref 3.0–12.0)
Neutro Abs: 3.5 10*3/uL (ref 1.4–7.7)
Neutrophils Relative %: 52.1 % (ref 43.0–77.0)
Platelets: 379 10*3/uL (ref 150.0–400.0)
RBC: 5.39 Mil/uL (ref 4.22–5.81)
RDW: 14.7 % (ref 11.5–15.5)
WBC: 6.8 10*3/uL (ref 4.0–10.5)

## 2022-06-06 LAB — BASIC METABOLIC PANEL
BUN: 14 mg/dL (ref 6–23)
CO2: 27 mEq/L (ref 19–32)
Calcium: 9.2 mg/dL (ref 8.4–10.5)
Chloride: 104 mEq/L (ref 96–112)
Creatinine, Ser: 1 mg/dL (ref 0.40–1.50)
GFR: 84.73 mL/min (ref >60.00)
Glucose, Bld: 108 mg/dL — ABNORMAL HIGH (ref 70–99)
Potassium: 3.7 mEq/L (ref 3.5–5.1)
Sodium: 139 mEq/L (ref 135–145)

## 2022-06-06 NOTE — Progress Notes (Signed)
Subjective:  Patient ID: Jeffrey Deleon, male    DOB: [birth year]  Age: 57 y.o. MRN: [id number]  CC: Hypertension and Hyperlipidemia   HPI Jeffrey Deleon presents for f/up  ---  He quit smoking cigarettes 6 months ago.  He runs on a treadmill for 30 minutes a day.  His endurance is good.  He denies chest pain, shortness of breath, diaphoresis, or edema.  Outpatient Medications Prior to Visit  Medication Sig Dispense Refill   aspirin EC 81 MG tablet Take 1 tablet (81 mg total) by mouth daily. 90 tablet 1   carvedilol (COREG CR) 10 MG 24 hr capsule Take 1 capsule (10 mg total) by mouth daily. Follow- appt is due must see provider for future refills 90 capsule 0   indapamide (LOZOL) 1.25 MG tablet Take 1 tablet (1.25 mg total) by mouth daily. Follow-up appt is due must see MD for refills 30 tablet 0   rosuvastatin (CRESTOR) 20 MG tablet Take 1 tablet (20 mg total) by mouth daily. 90 tablet 1   Cholecalciferol 1.25 MG (50000 UT) capsule Take 1 capsule (50,000 Units total) by mouth once a week. 12 capsule 1   No facility-administered medications prior to visit.    ROS Review of Systems  Constitutional:  Negative for chills, diaphoresis, fatigue and fever.  HENT: Negative.    Eyes: Negative.   Respiratory:  Negative for cough, chest tightness, shortness of breath and wheezing.   Cardiovascular:  Negative for chest pain, palpitations and leg swelling.  Gastrointestinal:  Negative for abdominal pain, constipation, diarrhea, nausea and vomiting.  Endocrine: Negative.   Genitourinary:  Negative for difficulty urinating and dysuria.  Musculoskeletal: Negative.  Negative for arthralgias, joint swelling and myalgias.  Skin: Negative.  Negative for color change.  Neurological:  Negative for dizziness, weakness and light-headedness.  Hematological:  Negative for adenopathy. Does not bruise/bleed easily.  Psychiatric/Behavioral: Negative.      Objective:  BP 128/88 (BP Location: Left Arm,  Patient Position: Sitting, Cuff Size: Normal)   Pulse 92   Temp 98.7 F (37.1 C) (Oral)   Ht 5\' 8"  (1.727 m)   Wt 201 lb (91.2 kg)   SpO2 98%   BMI 30.56 kg/m   BP Readings from Last 3 Encounters:  06/06/22 128/88  01/24/22 (!) 146/94  12/23/20 135/89    Wt Readings from Last 3 Encounters:  06/06/22 201 lb (91.2 kg)  04/18/22 203 lb (92.1 kg)  01/24/22 203 lb (92.1 kg)    Physical Exam Vitals reviewed.  HENT:     Nose: Nose normal.     Mouth/Throat:     Mouth: Mucous membranes are moist.  Eyes:     General: No scleral icterus.    Conjunctiva/sclera: Conjunctivae normal.  Cardiovascular:     Rate and Rhythm: Normal rate and regular rhythm.     Heart sounds: No murmur heard.    No friction rub. No gallop.  Pulmonary:     Effort: Pulmonary effort is normal.     Breath sounds: No stridor. No wheezing, rhonchi or rales.  Abdominal:     General: Abdomen is flat.     Palpations: There is no mass.     Tenderness: There is no abdominal tenderness. There is no guarding.     Hernia: No hernia is present.  Musculoskeletal:        General: Normal range of motion.     Cervical back: Neck supple.     Right lower leg: No edema.  Left lower leg: No edema.  Lymphadenopathy:     Cervical: No cervical adenopathy.  Skin:    General: Skin is warm and dry.  Neurological:     General: No focal deficit present.     Mental Status: Mental status is at baseline.  Psychiatric:        Mood and Affect: Mood normal.        Behavior: Behavior normal.     Lab Results  Component Value Date   WBC 6.8 06/06/2022   HGB 16.0 06/06/2022   HCT 48.8 06/06/2022   PLT 379.0 06/06/2022   GLUCOSE 108 (H) 06/06/2022   CHOL 184 01/24/2022   TRIG 92.0 01/24/2022   HDL 47.40 01/24/2022   LDLCALC 118 (H) 01/24/2022   ALT 19 01/24/2022   AST 19 01/24/2022   NA 139 06/06/2022   K 3.7 06/06/2022   CL 104 06/06/2022   CREATININE 1.00 06/06/2022   BUN 14 06/06/2022   CO2 27 06/06/2022    TSH 1.69 01/24/2022   PSA 0.55 01/24/2022    CT CHEST LUNG CA SCREEN LOW DOSE W/O CM  Result Date: 03/07/2022 CLINICAL DATA:  57 year old male former smoker, quit 1 year ago, with 20 pack-year history of smoking, for initial lung cancer screening EXAM: CT CHEST WITHOUT CONTRAST LOW-DOSE FOR LUNG CANCER SCREENING TECHNIQUE: Multidetector CT imaging of the chest was performed following the standard protocol without IV contrast. RADIATION DOSE REDUCTION: This exam was performed according to the departmental dose-optimization program which includes automated exposure control, adjustment of the mA and/or kV according to patient size and/or use of iterative reconstruction technique. COMPARISON:  None Available. FINDINGS: Cardiovascular: The heart is normal in size. No pericardial effusion. No evidence of thoracic aortic aneurysm. Moderate coronary atherosclerosis of the LAD and right coronary artery. Mediastinum/Nodes: No suspicious mediastinal lymphadenopathy. Visualized thyroid is unremarkable. Lungs/Pleura: Mild biapical pleural-parenchymal scarring. Mild centrilobular and paraseptal emphysematous changes, upper lung predominant. 2.7 mm nodule in the inferior right upper lobe. No focal consolidation. No pleural effusion or pneumothorax. Upper Abdomen: Visualized upper abdomen is grossly unremarkable. Musculoskeletal: Mild degenerative changes of the mid/lower thoracic spine. IMPRESSION: Lung-RADS 2, benign appearance or behavior. Continue annual screening with low-dose chest CT without contrast in 12 months. Emphysema (ICD10-J43.9). Electronically Signed   By: Charline Bills M.D.   On: 03/07/2022 19:22   Assessment & Plan:   Primary hypertension- His blood pressure is adequately well-controlled.  Electrolytes and renal function are normal. -     Basic metabolic panel; Future -     Carvedilol Phosphate ER; Take 1 capsule (10 mg total) by mouth daily. Follow- appt is due must see provider for future  refills  Dispense: 90 capsule; Refill: 1 -     Indapamide; Take 1 tablet (1.25 mg total) by mouth daily. Follow-up appt is due must see MD for refills  Dispense: 90 tablet; Refill: 1  Thrombocytosis- His platelets are normal now. -     CBC with Differential/Platelet; Future  Hyperlipidemia with target LDL less than 100- LDL goal achieved. Doing well on the statin  -     Rosuvastatin Calcium; Take 1 tablet (20 mg total) by mouth daily.  Dispense: 90 tablet; Refill: 1     Follow-up: Return in about 6 months (around 12/07/2022).  Sanda Linger, MD

## 2022-06-06 NOTE — Patient Instructions (Signed)
Hypertension, Adult High blood pressure (hypertension) is when the force of blood pumping through the arteries is too strong. The arteries are the blood vessels that carry blood from the heart throughout the body. Hypertension forces the heart to work harder to pump blood and may cause arteries to become narrow or stiff. Untreated or uncontrolled hypertension can lead to a heart attack, heart failure, a stroke, kidney disease, and other problems. A blood pressure reading consists of a higher number over a lower number. Ideally, your blood pressure should be below 120/80. The first ("top") number is called the systolic pressure. It is a measure of the pressure in your arteries as your heart beats. The second ("bottom") number is called the diastolic pressure. It is a measure of the pressure in your arteries as the heart relaxes. What are the causes? The exact cause of this condition is not known. There are some conditions that result in high blood pressure. What increases the risk? Certain factors may make you more likely to develop high blood pressure. Some of these risk factors are under your control, including: Smoking. Not getting enough exercise or physical activity. Being overweight. Having too much fat, sugar, calories, or salt (sodium) in your diet. Drinking too much alcohol. Other risk factors include: Having a personal history of heart disease, diabetes, high cholesterol, or kidney disease. Stress. Having a family history of high blood pressure and high cholesterol. Having obstructive sleep apnea. Age. The risk increases with age. What are the signs or symptoms? High blood pressure may not cause symptoms. Very high blood pressure (hypertensive crisis) may cause: Headache. Fast or irregular heartbeats (palpitations). Shortness of breath. Nosebleed. Nausea and vomiting. Vision changes. Severe chest pain, dizziness, and seizures. How is this diagnosed? This condition is diagnosed by  measuring your blood pressure while you are seated, with your arm resting on a flat surface, your legs uncrossed, and your feet flat on the floor. The cuff of the blood pressure monitor will be placed directly against the skin of your upper arm at the level of your heart. Blood pressure should be measured at least twice using the same arm. Certain conditions can cause a difference in blood pressure between your right and left arms. If you have a high blood pressure reading during one visit or you have normal blood pressure with other risk factors, you may be asked to: Return on a different day to have your blood pressure checked again. Monitor your blood pressure at home for 1 week or longer. If you are diagnosed with hypertension, you may have other blood or imaging tests to help your health care provider understand your overall risk for other conditions. How is this treated? This condition is treated by making healthy lifestyle changes, such as eating healthy foods, exercising more, and reducing your alcohol intake. You may be referred for counseling on a healthy diet and physical activity. Your health care provider may prescribe medicine if lifestyle changes are not enough to get your blood pressure under control and if: Your systolic blood pressure is above 130. Your diastolic blood pressure is above 80. Your personal target blood pressure may vary depending on your medical conditions, your age, and other factors. Follow these instructions at home: Eating and drinking  Eat a diet that is high in fiber and potassium, and low in sodium, added sugar, and fat. An example of this eating plan is called the DASH diet. DASH stands for Dietary Approaches to Stop Hypertension. To eat this way: Eat   plenty of fresh fruits and vegetables. Try to fill one half of your plate at each meal with fruits and vegetables. Eat whole grains, such as whole-wheat pasta, brown rice, or whole-grain bread. Fill about one  fourth of your plate with whole grains. Eat or drink low-fat dairy products, such as skim milk or low-fat yogurt. Avoid fatty cuts of meat, processed or cured meats, and poultry with skin. Fill about one fourth of your plate with lean proteins, such as fish, chicken without skin, beans, eggs, or tofu. Avoid pre-made and processed foods. These tend to be higher in sodium, added sugar, and fat. Reduce your daily sodium intake. Many people with hypertension should eat less than 1,500 mg of sodium a day. Do not drink alcohol if: Your health care provider tells you not to drink. You are pregnant, may be pregnant, or are planning to become pregnant. If you drink alcohol: Limit how much you have to: 0-1 drink a day for women. 0-2 drinks a day for men. Know how much alcohol is in your drink. In the [place] one drink equals one 12 oz bottle of beer (355 mL), one 5 oz glass of wine (148 mL), or one 1 oz glass of hard liquor (44 mL). Lifestyle  Work with your health care provider to maintain a healthy body weight or to lose weight. Ask what an ideal weight is for you. Get at least 30 minutes of exercise that causes your heart to beat faster (aerobic exercise) most days of the week. Activities may include walking, swimming, or biking. Include exercise to strengthen your muscles (resistance exercise), such as Pilates or lifting weights, as part of your weekly exercise routine. Try to do these types of exercises for 30 minutes at least 3 days a week. Do not use any products that contain nicotine or tobacco. These products include cigarettes, chewing tobacco, and vaping devices, such as e-cigarettes. If you need help quitting, ask your health care provider. Monitor your blood pressure at home as told by your health care provider. Keep all follow-up visits. This is important. Medicines Take over-the-counter and prescription medicines only as told by your health care provider. Follow directions carefully. Blood  pressure medicines must be taken as prescribed. Do not skip doses of blood pressure medicine. Doing this puts you at risk for problems and can make the medicine less effective. Ask your health care provider about side effects or reactions to medicines that you should watch for. Contact a health care provider if you: Think you are having a reaction to a medicine you are taking. Have headaches that keep coming back (recurring). Feel dizzy. Have swelling in your ankles. Have trouble with your vision. Get help right away if you: Develop a severe headache or confusion. Have unusual weakness or numbness. Feel faint. Have severe pain in your chest or abdomen. Vomit repeatedly. Have trouble breathing. These symptoms may be an emergency. Get help right away. Call 911. Do not wait to see if the symptoms will go away. Do not drive yourself to the hospital. Summary Hypertension is when the force of blood pumping through your arteries is too strong. If this condition is not controlled, it may put you at risk for serious complications. Your personal target blood pressure may vary depending on your medical conditions, your age, and other factors. For most people, a normal blood pressure is less than 120/80. Hypertension is treated with lifestyle changes, medicines, or a combination of both. Lifestyle changes include losing weight, eating a healthy,   low-sodium diet, exercising more, and limiting alcohol. This information is not intended to replace advice given to you by your health care provider. Make sure you discuss any questions you have with your health care provider. Document Revised: 11/01/2020 Document Reviewed: 11/01/2020 [employer] Patient Education  2024 [employer]  

## 2022-06-10 MED ORDER — INDAPAMIDE 1.25 MG PO TABS
1.2500 mg | ORAL_TABLET | Freq: Every day | ORAL | 1 refills | Status: DC
Start: 2022-06-10 — End: 2022-10-18

## 2022-06-10 MED ORDER — CARVEDILOL PHOSPHATE ER 10 MG PO CP24
10.0000 mg | ORAL_CAPSULE | Freq: Every day | ORAL | 1 refills | Status: DC
Start: 2022-06-10 — End: 2022-10-18

## 2022-06-10 MED ORDER — ROSUVASTATIN CALCIUM 20 MG PO TABS
20.0000 mg | ORAL_TABLET | Freq: Every day | ORAL | 1 refills | Status: DC
Start: 2022-06-10 — End: 2022-08-04

## 2022-08-04 ENCOUNTER — Other Ambulatory Visit: Payer: Self-pay | Admitting: Internal Medicine

## 2022-08-04 DIAGNOSIS — E785 Hyperlipidemia, unspecified: Secondary | ICD-10-CM

## 2022-09-27 ENCOUNTER — Other Ambulatory Visit: Payer: Self-pay | Admitting: Internal Medicine

## 2022-09-27 DIAGNOSIS — E785 Hyperlipidemia, unspecified: Secondary | ICD-10-CM

## 2022-09-28 MED ORDER — ROSUVASTATIN CALCIUM 20 MG PO TABS: 20.0000 mg | ORAL_TABLET | Freq: Every day | ORAL | 0 refills | Status: DC

## 2022-10-18 ENCOUNTER — Encounter: Payer: Self-pay | Admitting: Internal Medicine

## 2022-10-18 ENCOUNTER — Ambulatory Visit: Payer: 59 | Admitting: Internal Medicine

## 2022-10-18 VITALS — BP 126/84 | HR 71 | Temp 98.6°F | Resp 16 | Ht 68.0 in | Wt 202.0 lb

## 2022-10-18 DIAGNOSIS — Z23 Encounter for immunization: Secondary | ICD-10-CM

## 2022-10-18 DIAGNOSIS — E785 Hyperlipidemia, unspecified: Secondary | ICD-10-CM | POA: Diagnosis not present

## 2022-10-18 DIAGNOSIS — I10 Essential (primary) hypertension: Secondary | ICD-10-CM

## 2022-10-18 LAB — BASIC METABOLIC PANEL
BUN: 13 mg/dL (ref 6–23)
CO2: 31 meq/L (ref 19–32)
Calcium: 9.8 mg/dL (ref 8.4–10.5)
Chloride: 101 meq/L (ref 96–112)
Creatinine, Ser: 1.05 mg/dL (ref 0.40–1.50)
GFR: 79.7 mL/min (ref >60.00)
Glucose, Bld: 79 mg/dL (ref 70–99)
Potassium: 3.6 meq/L (ref 3.5–5.1)
Sodium: 138 meq/L (ref 135–145)

## 2022-10-18 MED ORDER — CARVEDILOL PHOSPHATE ER 10 MG PO CP24
10.0000 mg | ORAL_CAPSULE | Freq: Every day | ORAL | 1 refills | Status: AC
Start: 2022-10-18 — End: ?

## 2022-10-18 MED ORDER — INDAPAMIDE 1.25 MG PO TABS
1.2500 mg | ORAL_TABLET | Freq: Every day | ORAL | 1 refills | Status: DC
Start: 2022-10-18 — End: 2022-12-23

## 2022-10-18 MED ORDER — ROSUVASTATIN CALCIUM 20 MG PO TABS: 20.0000 mg | ORAL_TABLET | Freq: Every day | ORAL | 1 refills | Status: AC

## 2022-10-18 NOTE — Progress Notes (Signed)
Subjective:  Patient ID: Jeffrey Deleon, male    DOB: Jun 25, 1965  Age: 57 y.o. MRN: [id number]  CC: Hypertension and Hyperlipidemia   HPI Jeffrey Deleon presents for f/up ----  Discussed the use of AI scribe software for clinical note transcription with the patient, who gave verbal consent to proceed.  History of Present Illness   The patient, with a history of hypertension and hyperlipidemia, reports no recent health issues. He has been maintaining an active lifestyle, walking 10-30 minutes daily at his workplace gym. He denies experiencing any chest pain, shortness of breath, dizziness, lightheadedness, or irregular heartbeats. He also denies any muscle or joint aches related to his cholesterol medication.  The patient has received a shingles vaccine in January of the current year and is due for a booster. He reports a stable weight, although he expresses frustration that it won't decrease. He acknowledges that his diet may not be optimal. He reports a discrepancy in weight measurements between the clinic and his workplace, attributing this to the removal of items from his pockets and boots at work.  The patient denies any symptoms of high or low blood pressure. He mentions a single instance of a headache after consuming pork, which he managed by drinking water and reducing his pork intake. He has also cut back on soda consumption, opting for fruit punch or water, and has incorporated more greens and salads into his diet.  The patient reports a significant lifestyle change: he has quit smoking. He was previously smoking three packs a week, then cut back to a pack a week, and has now been smoke-free for approximately seventeen days. He expresses a commitment to maintaining this change.       Outpatient Medications Prior to Visit  Medication Sig Dispense Refill   aspirin EC 81 MG tablet Take 1 tablet (81 mg total) by mouth daily. 90 tablet 1   Cholecalciferol 1.25 MG (50000 UT) capsule Take 1  capsule (50,000 Units total) by mouth once a week. 12 capsule 1   carvedilol (COREG CR) 10 MG 24 hr capsule Take 1 capsule (10 mg total) by mouth daily. Follow- appt is due must see provider for future refills 90 capsule 1   indapamide (LOZOL) 1.25 MG tablet Take 1 tablet (1.25 mg total) by mouth daily. Follow-up appt is due must see MD for refills 90 tablet 1   rosuvastatin (CRESTOR) 20 MG tablet Take 1 tablet (20 mg total) by mouth daily. 90 tablet 0   No facility-administered medications prior to visit.    ROS Review of Systems  Constitutional:  Negative for diaphoresis and fatigue.  HENT: Negative.    Eyes: Negative.   Respiratory:  Negative for cough, chest tightness, shortness of breath and wheezing.   Cardiovascular:  Negative for chest pain, palpitations and leg swelling.  Gastrointestinal:  Negative for abdominal pain, constipation, diarrhea, nausea and vomiting.  Genitourinary: Negative.  Negative for difficulty urinating.  Musculoskeletal: Negative.  Negative for arthralgias and myalgias.  Skin: Negative.   Neurological:  Negative for dizziness, weakness and light-headedness.  Hematological:  Negative for adenopathy. Does not bruise/bleed easily.  Psychiatric/Behavioral: Negative.      Objective:  BP 126/84 (BP Location: Left Arm, Patient Position: Sitting, Cuff Size: Large)   Pulse 71   Temp 98.6 F (37 C) (Oral)   Resp 16   Ht 5\' 8"  (1.727 m)   Wt 202 lb (91.6 kg)   SpO2 96%   BMI 30.71 kg/m   BP Readings  from Last 3 Encounters:  10/18/22 126/84  06/06/22 128/88  01/24/22 (!) 146/94    Wt Readings from Last 3 Encounters:  10/18/22 202 lb (91.6 kg)  06/06/22 201 lb (91.2 kg)  04/18/22 203 lb (92.1 kg)    Physical Exam Vitals reviewed.  Constitutional:      Appearance: Normal appearance.  HENT:     Mouth/Throat:     Mouth: Mucous membranes are moist.  Eyes:     General: No scleral icterus.    Conjunctiva/sclera: Conjunctivae normal.   Cardiovascular:     Rate and Rhythm: Normal rate and regular rhythm.     Heart sounds: No murmur heard.    No gallop.  Pulmonary:     Effort: Pulmonary effort is normal.     Breath sounds: No stridor. No wheezing, rhonchi or rales.  Abdominal:     General: Abdomen is flat.     Palpations: There is no mass.     Tenderness: There is no abdominal tenderness. There is no guarding.     Hernia: No hernia is present.  Musculoskeletal:        General: Normal range of motion.     Cervical back: Neck supple.     Right lower leg: No edema.     Left lower leg: No edema.  Lymphadenopathy:     Cervical: No cervical adenopathy.  Skin:    General: Skin is dry.  Neurological:     General: No focal deficit present.     Mental Status: He is alert. Mental status is at baseline.  Psychiatric:        Mood and Affect: Mood normal.        Behavior: Behavior normal.     Lab Results  Component Value Date   WBC 6.8 06/06/2022   HGB 16.0 06/06/2022   HCT 48.8 06/06/2022   PLT 379.0 06/06/2022   GLUCOSE 79 10/18/2022   CHOL 184 01/24/2022   TRIG 92.0 01/24/2022   HDL 47.40 01/24/2022   LDLCALC 118 (H) 01/24/2022   ALT 19 01/24/2022   AST 19 01/24/2022   NA 138 10/18/2022   K 3.6 10/18/2022   CL 101 10/18/2022   CREATININE 1.05 10/18/2022   BUN 13 10/18/2022   CO2 31 10/18/2022   TSH 1.69 01/24/2022   PSA 0.55 01/24/2022    CT CHEST LUNG CA SCREEN LOW DOSE W/O CM  Result Date: 03/07/2022 CLINICAL DATA:  57 year old male former smoker, quit 1 year ago, with 20 pack-year history of smoking, for initial lung cancer screening EXAM: CT CHEST WITHOUT CONTRAST LOW-DOSE FOR LUNG CANCER SCREENING TECHNIQUE: Multidetector CT imaging of the chest was performed following the standard protocol without IV contrast. RADIATION DOSE REDUCTION: This exam was performed according to the departmental dose-optimization program which includes automated exposure control, adjustment of the mA and/or kV according  to patient size and/or use of iterative reconstruction technique. COMPARISON:  None Available. FINDINGS: Cardiovascular: The heart is normal in size. No pericardial effusion. No evidence of thoracic aortic aneurysm. Moderate coronary atherosclerosis of the LAD and right coronary artery. Mediastinum/Nodes: No suspicious mediastinal lymphadenopathy. Visualized thyroid is unremarkable. Lungs/Pleura: Mild biapical pleural-parenchymal scarring. Mild centrilobular and paraseptal emphysematous changes, upper lung predominant. 2.7 mm nodule in the inferior right upper lobe. No focal consolidation. No pleural effusion or pneumothorax. Upper Abdomen: Visualized upper abdomen is grossly unremarkable. Musculoskeletal: Mild degenerative changes of the mid/lower thoracic spine. IMPRESSION: Lung-RADS 2, benign appearance or behavior. Continue annual screening with low-dose chest CT  without contrast in 12 months. Emphysema (ICD10-J43.9). Electronically Signed   By: Charline Bills M.D.   On: 03/07/2022 19:22   Assessment & Plan:   Primary hypertension- His BP is well controlled. -     Basic metabolic panel; Future -     Carvedilol Phosphate ER; Take 1 capsule (10 mg total) by mouth daily.  Dispense: 90 capsule; Refill: 1 -     Indapamide; Take 1 tablet (1.25 mg total) by mouth daily.  Dispense: 90 tablet; Refill: 1  Hyperlipidemia with target LDL less than 100 - LDL goal achieved. Doing well on the statin  -     Rosuvastatin Calcium; Take 1 tablet (20 mg total) by mouth daily.  Dispense: 90 tablet; Refill: 1  Other orders -     Varicella-zoster vaccine IM     Follow-up: Return in about 4 months (around 02/18/2023).  Sanda Linger, MD

## 2022-10-18 NOTE — Patient Instructions (Signed)
Hypertension, Adult High blood pressure (hypertension) is when the force of blood pumping through the arteries is too strong. The arteries are the blood vessels that carry blood from the heart throughout the body. Hypertension forces the heart to work harder to pump blood and may cause arteries to become narrow or stiff. Untreated or uncontrolled hypertension can lead to a heart attack, heart failure, a stroke, kidney disease, and other problems. A blood pressure reading consists of a higher number over a lower number. Ideally, your blood pressure should be below 120/80. The first ("top") number is called the systolic pressure. It is a measure of the pressure in your arteries as your heart beats. The second ("bottom") number is called the diastolic pressure. It is a measure of the pressure in your arteries as the heart relaxes. What are the causes? The exact cause of this condition is not known. There are some conditions that result in high blood pressure. What increases the risk? Certain factors may make you more likely to develop high blood pressure. Some of these risk factors are under your control, including: Smoking. Not getting enough exercise or physical activity. Being overweight. Having too much fat, sugar, calories, or salt (sodium) in your diet. Drinking too much alcohol. Other risk factors include: Having a personal history of heart disease, diabetes, high cholesterol, or kidney disease. Stress. Having a family history of high blood pressure and high cholesterol. Having obstructive sleep apnea. Age. The risk increases with age. What are the signs or symptoms? High blood pressure may not cause symptoms. Very high blood pressure (hypertensive crisis) may cause: Headache. Fast or irregular heartbeats (palpitations). Shortness of breath. Nosebleed. Nausea and vomiting. Vision changes. Severe chest pain, dizziness, and seizures. How is this diagnosed? This condition is diagnosed by  measuring your blood pressure while you are seated, with your arm resting on a flat surface, your legs uncrossed, and your feet flat on the floor. The cuff of the blood pressure monitor will be placed directly against the skin of your upper arm at the level of your heart. Blood pressure should be measured at least twice using the same arm. Certain conditions can cause a difference in blood pressure between your right and left arms. If you have a high blood pressure reading during one visit or you have normal blood pressure with other risk factors, you may be asked to: Return on a different day to have your blood pressure checked again. Monitor your blood pressure at home for 1 week or longer. If you are diagnosed with hypertension, you may have other blood or imaging tests to help your health care provider understand your overall risk for other conditions. How is this treated? This condition is treated by making healthy lifestyle changes, such as eating healthy foods, exercising more, and reducing your alcohol intake. You may be referred for counseling on a healthy diet and physical activity. Your health care provider may prescribe medicine if lifestyle changes are not enough to get your blood pressure under control and if: Your systolic blood pressure is above 130. Your diastolic blood pressure is above 80. Your personal target blood pressure may vary depending on your medical conditions, your age, and other factors. Follow these instructions at home: Eating and drinking  Eat a diet that is high in fiber and potassium, and low in sodium, added sugar, and fat. An example of this eating plan is called the DASH diet. DASH stands for Dietary Approaches to Stop Hypertension. To eat this way: Eat   plenty of fresh fruits and vegetables. Try to fill one half of your plate at each meal with fruits and vegetables. Eat whole grains, such as whole-wheat pasta, brown rice, or whole-grain bread. Fill about one  fourth of your plate with whole grains. Eat or drink low-fat dairy products, such as skim milk or low-fat yogurt. Avoid fatty cuts of meat, processed or cured meats, and poultry with skin. Fill about one fourth of your plate with lean proteins, such as fish, chicken without skin, beans, eggs, or tofu. Avoid pre-made and processed foods. These tend to be higher in sodium, added sugar, and fat. Reduce your daily sodium intake. Many people with hypertension should eat less than 1,500 mg of sodium a day. Do not drink alcohol if: Your health care provider tells you not to drink. You are pregnant, may be pregnant, or are planning to become pregnant. If you drink alcohol: Limit how much you have to: 0-1 drink a day for women. 0-2 drinks a day for men. Know how much alcohol is in your drink. In the [place] one drink equals one 12 oz bottle of beer (355 mL), one 5 oz glass of wine (148 mL), or one 1 oz glass of hard liquor (44 mL). Lifestyle  Work with your health care provider to maintain a healthy body weight or to lose weight. Ask what an ideal weight is for you. Get at least 30 minutes of exercise that causes your heart to beat faster (aerobic exercise) most days of the week. Activities may include walking, swimming, or biking. Include exercise to strengthen your muscles (resistance exercise), such as Pilates or lifting weights, as part of your weekly exercise routine. Try to do these types of exercises for 30 minutes at least 3 days a week. Do not use any products that contain nicotine or tobacco. These products include cigarettes, chewing tobacco, and vaping devices, such as e-cigarettes. If you need help quitting, ask your health care provider. Monitor your blood pressure at home as told by your health care provider. Keep all follow-up visits. This is important. Medicines Take over-the-counter and prescription medicines only as told by your health care provider. Follow directions carefully. Blood  pressure medicines must be taken as prescribed. Do not skip doses of blood pressure medicine. Doing this puts you at risk for problems and can make the medicine less effective. Ask your health care provider about side effects or reactions to medicines that you should watch for. Contact a health care provider if you: Think you are having a reaction to a medicine you are taking. Have headaches that keep coming back (recurring). Feel dizzy. Have swelling in your ankles. Have trouble with your vision. Get help right away if you: Develop a severe headache or confusion. Have unusual weakness or numbness. Feel faint. Have severe pain in your chest or abdomen. Vomit repeatedly. Have trouble breathing. These symptoms may be an emergency. Get help right away. Call 911. Do not wait to see if the symptoms will go away. Do not drive yourself to the hospital. Summary Hypertension is when the force of blood pumping through your arteries is too strong. If this condition is not controlled, it may put you at risk for serious complications. Your personal target blood pressure may vary depending on your medical conditions, your age, and other factors. For most people, a normal blood pressure is less than 120/80. Hypertension is treated with lifestyle changes, medicines, or a combination of both. Lifestyle changes include losing weight, eating a healthy,   low-sodium diet, exercising more, and limiting alcohol. This information is not intended to replace advice given to you by your health care provider. Make sure you discuss any questions you have with your health care provider. Document Revised: 11/01/2020 Document Reviewed: 11/01/2020 [employer] Patient Education  2024 [employer]  

## 2022-11-08 ENCOUNTER — Ambulatory Visit: Payer: 59 | Admitting: Internal Medicine

## 2022-12-23 ENCOUNTER — Other Ambulatory Visit: Payer: Self-pay | Admitting: Internal Medicine

## 2022-12-23 DIAGNOSIS — I10 Essential (primary) hypertension: Secondary | ICD-10-CM

## 2023-03-08 ENCOUNTER — Ambulatory Visit
Admission: RE | Admit: 2023-03-08 | Discharge: 2023-03-08 | Disposition: A | Discharge status: Home / Self Care | Payer: 59 | Source: Ambulatory Visit | Attending: Acute Care | Admitting: Acute Care

## 2023-03-08 DIAGNOSIS — Z122 Encounter for screening for malignant neoplasm of respiratory organs: Secondary | ICD-10-CM

## 2023-03-08 DIAGNOSIS — Z87891 Personal history of nicotine dependence: Secondary | ICD-10-CM

## 2023-03-08 NOTE — Progress Notes (Signed)
 Former smoker x 2years  20 pack year  Asymptomatic

## 2023-04-05 ENCOUNTER — Other Ambulatory Visit: Payer: Self-pay

## 2023-04-05 DIAGNOSIS — Z87891 Personal history of nicotine dependence: Secondary | ICD-10-CM

## 2023-04-05 DIAGNOSIS — Z122 Encounter for screening for malignant neoplasm of respiratory organs: Secondary | ICD-10-CM

## 2023-06-04 ENCOUNTER — Other Ambulatory Visit: Payer: Self-pay | Admitting: Internal Medicine

## 2023-06-04 DIAGNOSIS — I2510 Atherosclerotic heart disease of native coronary artery without angina pectoris: Secondary | ICD-10-CM | POA: Insufficient documentation

## 2024-03-09 ENCOUNTER — Other Ambulatory Visit
